# Patient Record
Sex: Female | Born: 1955 | Race: White | Hispanic: No | Marital: Single | Smoking: Never smoker
Health system: Southern US, Community
[De-identification: ages and names within clinical notes are randomized; demographics above are authoritative.]

## PROBLEM LIST (undated history)

## (undated) DIAGNOSIS — R5381 Other malaise: Secondary | ICD-10-CM

## (undated) DIAGNOSIS — L0201 Cutaneous abscess of face: Secondary | ICD-10-CM

## (undated) DIAGNOSIS — Z0000 Encounter for general adult medical examination without abnormal findings: Secondary | ICD-10-CM

## (undated) DIAGNOSIS — Z1239 Encounter for other screening for malignant neoplasm of breast: Secondary | ICD-10-CM

## (undated) DIAGNOSIS — R739 Hyperglycemia, unspecified: Secondary | ICD-10-CM

## (undated) DIAGNOSIS — Z1211 Encounter for screening for malignant neoplasm of colon: Secondary | ICD-10-CM

## (undated) DIAGNOSIS — L03211 Cellulitis of face: Secondary | ICD-10-CM

## (undated) DIAGNOSIS — Z124 Encounter for screening for malignant neoplasm of cervix: Secondary | ICD-10-CM

## (undated) DIAGNOSIS — Z13220 Encounter for screening for lipoid disorders: Secondary | ICD-10-CM

## (undated) DIAGNOSIS — F79 Unspecified intellectual disabilities: Secondary | ICD-10-CM

## (undated) DIAGNOSIS — R5383 Other fatigue: Secondary | ICD-10-CM

## (undated) HISTORY — DX: Encounter for screening for malignant neoplasm of cervix: Z12.4

## (undated) HISTORY — DX: Cellulitis of face: L03.211

## (undated) HISTORY — DX: Encounter for other screening for malignant neoplasm of breast: Z12.39

## (undated) HISTORY — DX: Encounter for screening for lipoid disorders: Z13.220

## (undated) HISTORY — DX: Hyperglycemia, unspecified: R73.9

## (undated) HISTORY — DX: Cutaneous abscess of face: L02.01

## (undated) HISTORY — DX: Encounter for screening for malignant neoplasm of colon: Z12.11

## (undated) HISTORY — DX: Encounter for general adult medical examination without abnormal findings: Z00.00

## (undated) HISTORY — DX: Unspecified intellectual disabilities: F79

## (undated) HISTORY — DX: Other malaise: R53.81

## (undated) HISTORY — DX: Other fatigue: R53.83

---

## 2005-12-21 ENCOUNTER — Ambulatory Visit: Payer: Self-pay | Admitting: Internal Medicine

## 2006-02-27 ENCOUNTER — Ambulatory Visit: Payer: Self-pay | Admitting: Family Medicine

## 2006-03-01 ENCOUNTER — Ambulatory Visit: Payer: Self-pay | Admitting: Family Medicine

## 2006-03-04 ENCOUNTER — Ambulatory Visit: Payer: Self-pay | Admitting: Family Medicine

## 2006-03-11 ENCOUNTER — Ambulatory Visit: Payer: Self-pay | Admitting: Family Medicine

## 2006-03-20 ENCOUNTER — Ambulatory Visit: Payer: Self-pay | Admitting: Family Medicine

## 2006-04-01 ENCOUNTER — Ambulatory Visit: Payer: Self-pay | Admitting: Family Medicine

## 2006-04-05 ENCOUNTER — Ambulatory Visit: Payer: Self-pay | Admitting: Family Medicine

## 2006-04-29 ENCOUNTER — Ambulatory Visit: Payer: Self-pay | Admitting: Family Medicine

## 2006-11-08 ENCOUNTER — Ambulatory Visit: Payer: Self-pay | Admitting: Family Medicine

## 2006-11-08 DIAGNOSIS — L03211 Cellulitis of face: Secondary | ICD-10-CM

## 2006-11-08 DIAGNOSIS — L0201 Cutaneous abscess of face: Secondary | ICD-10-CM

## 2009-06-08 ENCOUNTER — Other Ambulatory Visit: Admission: RE | Admit: 2009-06-08 | Discharge: 2009-06-08 | Payer: Self-pay | Admitting: Family Medicine

## 2009-06-08 ENCOUNTER — Ambulatory Visit: Payer: Self-pay | Admitting: Family Medicine

## 2009-06-08 DIAGNOSIS — R5381 Other malaise: Secondary | ICD-10-CM

## 2009-06-08 DIAGNOSIS — R5383 Other fatigue: Secondary | ICD-10-CM

## 2009-06-13 ENCOUNTER — Encounter (INDEPENDENT_AMBULATORY_CARE_PROVIDER_SITE_OTHER): Payer: Self-pay | Admitting: *Deleted

## 2009-06-13 LAB — CONVERTED CEMR LAB
Alkaline Phosphatase: 66 units/L (ref 39–117)
Basophils Absolute: 0 10*3/uL (ref 0.0–0.1)
Bilirubin, Direct: 0.1 mg/dL (ref 0.0–0.3)
Calcium: 9.5 mg/dL (ref 8.4–10.5)
Eosinophils Absolute: 0.2 10*3/uL (ref 0.0–0.7)
GFR calc non Af Amer: 110.14 mL/min (ref >60)
HCT: 37.7 % (ref 36.0–46.0)
HDL: 42.4 mg/dL (ref >39.00)
Hemoglobin: 13.5 g/dL (ref 12.0–15.0)
Lymphs Abs: 1.9 10*3/uL (ref 0.7–4.0)
MCHC: 35.8 g/dL (ref 30.0–36.0)
Monocytes Relative: 5.2 % (ref 3.0–12.0)
Neutro Abs: 3.4 10*3/uL (ref 1.4–7.7)
RDW: 12.4 % (ref 11.5–14.6)
Sodium: 144 meq/L (ref 135–145)
Total Bilirubin: 0.5 mg/dL (ref 0.3–1.2)
Total CHOL/HDL Ratio: 6
Triglycerides: 279 mg/dL — ABNORMAL HIGH (ref 0.0–149.0)

## 2009-06-14 ENCOUNTER — Encounter (INDEPENDENT_AMBULATORY_CARE_PROVIDER_SITE_OTHER): Payer: Self-pay | Admitting: *Deleted

## 2009-07-04 ENCOUNTER — Ambulatory Visit: Payer: Self-pay | Admitting: Family Medicine

## 2009-07-04 ENCOUNTER — Encounter: Payer: Self-pay | Admitting: Family Medicine

## 2009-07-07 ENCOUNTER — Encounter: Payer: Self-pay | Admitting: Family Medicine

## 2009-07-11 ENCOUNTER — Ambulatory Visit: Payer: Self-pay | Admitting: Family Medicine

## 2009-07-11 ENCOUNTER — Encounter (INDEPENDENT_AMBULATORY_CARE_PROVIDER_SITE_OTHER): Payer: Self-pay | Admitting: *Deleted

## 2009-07-12 LAB — CONVERTED CEMR LAB
AST: 14 units/L (ref 0–37)
Cholesterol: 180 mg/dL (ref 0–200)
HDL: 44.8 mg/dL (ref >39.00)
LDL Cholesterol: 101 mg/dL — ABNORMAL HIGH (ref 0–99)
VLDL: 34.4 mg/dL (ref 0.0–40.0)

## 2009-08-05 ENCOUNTER — Encounter (INDEPENDENT_AMBULATORY_CARE_PROVIDER_SITE_OTHER): Payer: Self-pay | Admitting: *Deleted

## 2009-08-10 ENCOUNTER — Ambulatory Visit: Payer: Self-pay | Admitting: Internal Medicine

## 2009-08-24 ENCOUNTER — Ambulatory Visit: Payer: Self-pay | Admitting: Internal Medicine

## 2010-03-21 NOTE — Miscellaneous (Signed)
Summary: [facility] Previsit/prep  Clinical Lists Changes  Medications: Added new medication of DULCOLAX 5 MG  TBEC (BISACODYL) Day before procedure take 2 at 3pm and 2 at 8pm. - Signed Added new medication of METOCLOPRAMIDE HCL 10 MG  TABS (METOCLOPRAMIDE HCL) As per prep instructions. - Signed Added new medication of MIRALAX   POWD (POLYETHYLENE GLYCOL 3350) As per prep  instructions. - Signed Rx of DULCOLAX 5 MG  TBEC (BISACODYL) Day before procedure take 2 at 3pm and 2 at 8pm.;  #4 x 0;  Signed;  Entered by: Wyona Almas RN;  Authorized by: Hart Carwin MD;  Method used: Electronically to [facility]*, [address] [place], [place]  [address], Ph: [phone], Fax: [phone] Rx of METOCLOPRAMIDE HCL 10 MG  TABS (METOCLOPRAMIDE HCL) As per prep instructions.;  #2 x 0;  Signed;  Entered by: Wyona Almas RN;  Authorized by: Hart Carwin MD;  Method used: Electronically to [facility]*, [address] [place], [place]  [address], Ph: [phone], Fax: [phone] Rx of MIRALAX   POWD (POLYETHYLENE GLYCOL 3350) As per prep  instructions.;  #255gm x 0;  Signed;  Entered by: Wyona Almas RN;  Authorized by: Hart Carwin MD;  Method used: Electronically to [facility]*, [address], [place], [place]  [address], Ph: [phone], Fax: [phone] Observations: Added new observation of ALLERGY REV: Done (08/10/2009 16:16)    Prescriptions: MIRALAX   POWD (POLYETHYLENE GLYCOL 3350) As per prep  instructions.  #255gm x 0   Entered by:   Wyona Almas RN   Authorized by:   Hart Carwin MD   Signed by:   Wyona Almas RN on 08/10/2009   Method used:   Electronically to        [facility]* (retail)       [address]       [place], [place]  [address]       Ph: [phone]       Fax: [phone]   RxID:   [id number] METOCLOPRAMIDE HCL 10 MG  TABS (METOCLOPRAMIDE HCL) As per prep instructions.  #2 x 0   Entered by:   Wyona Almas RN   Authorized by:   Hart Carwin MD   Signed by:   Wyona Almas RN on 08/10/2009   Method used:   Electronically to        [facility]* (retail)       [address]       [place], [place]  [address]       Ph: [phone]       Fax: [phone]   RxID:   [id number] DULCOLAX 5 MG  TBEC (BISACODYL) Day before procedure take 2 at 3pm and 2 at 8pm.  #4 x 0   Entered by:   Wyona Almas RN   Authorized by:   Hart Carwin MD   Signed by:   Wyona Almas RN on 08/10/2009   Method used:   Electronically to        [facility]* (retail)       [address]       [place], [place]  [address]       Ph: [phone]       Fax: [phone]   RxID:   [id number]

## 2010-03-21 NOTE — Letter (Signed)
Summary: Results Follow up Letter  [place] at [facility]  [address] [place], [place] [address]   Phone: [phone]  Fax: [phone]    06/14/2009 MRN: [id number]    [facility] [address] [place], [place]  [address]    Dear [name],  The following are the results of your recent test(s):  Test         Result    Pap Smear:        Normal __X___  Not Normal _____ Comments: ______________________________________________________ Cholesterol: LDL(Bad cholesterol):         Your goal is less than:         HDL (Good cholesterol):       Your goal is more than: Comments:  ______________________________________________________ Mammogram:        Normal _____  Not Normal _____ Comments:  ___________________________________________________________________ Hemoccult:        Normal _____  Not normal _______ Comments:    _____________________________________________________________________ Other Tests:    We routinely do not discuss normal results over the telephone.  If you desire a copy of the results, or you have any questions about this information we can discuss them at your next office visit.   Sincerely,    Ruthe Mannan,  M.D.  TA:lsf

## 2010-03-21 NOTE — Letter (Signed)
Summary: Previsit letter  [facility]  [address] [place], [place] [address]   Phone: [phone]  Fax: [phone]       07/11/2009 MRN: [id number]  [facility] [address] [place], [place]  [address]  Dear [name],  Welcome to the Gastroenterology Division at [facility].    You are scheduled to see a nurse for your pre-procedure visit on 08-10-09 at 4:30p.m. on the 3rd floor at [facility], [address]  We ask that you try to arrive at our office 15 minutes prior to your appointment time to allow for check-in.  Your nurse visit will consist of discussing your medical and surgical history, your immediate family medical history, and your medications.    Please bring a complete list of all your medications or, if you prefer, bring the medication bottles and we will list them.  We will need to be aware of both prescribed and over the counter drugs.  We will need to know exact dosage information as well.  If you are on blood thinners (Coumadin, Plavix, Aggrenox, Ticlid, etc.) please call our office today/prior to your appointment, as we need to consult with your physician about holding your medication.   Please be prepared to read and sign documents such as consent forms, a financial agreement, and acknowledgement forms.  If necessary, and with your consent, a friend or relative is welcome to sit-in on the nurse visit with you.  Please bring your insurance card so that we may make a copy of it.  If your insurance requires a referral to see a specialist, please bring your referral form from your primary care physician.  No co-pay is required for this nurse visit.     If you cannot keep your appointment, please call [phone] to cancel or reschedule prior to your appointment date.  This allows [place] the opportunity to schedule an appointment for another patient in need of care.    Thank you for choosing [facility] for your  medical needs.  We appreciate the opportunity to care for you.  Please visit [place] at our website  to learn more about our practice.                     Sincerely.                                                                                                                   The Gastroenterology Division

## 2010-03-21 NOTE — Assessment & Plan Note (Signed)
Summary: TICK BITE / LFW   Vital Signs:  Patient profile:   55 year old female Height:      61.5 inches Weight:      178 pounds BMI:     33.21 Temp:     98.3 degrees F oral Pulse rate:   72 / minute Pulse rhythm:   regular BP sitting:   120 / 80  (right arm) Cuff size:   large  Vitals Entered By: Linde Gillis CMA Duncan Dull) (Jul 11, 2009 10:04 AM) CC: ? Tick bite   History of Present Illness: 55 yo here for tick bite.  Felt something bite her last night on her right buttocks. Pulled something off but it wa dark and didnt see what it was. Since then, has a little redness and irritation at the site. No fevers, chills, malaise, headache, rash, nausea, vomiting or photophobia.  Current Medications (verified): 1)  Tylenol Pm Extra Strength 500-25 Mg Tabs (Diphenhydramine-Apap (Sleep)) .... Otc As Directed. 2)  Multivitamins   Tabs (Multiple Vitamin) .... Take 1 Tablet By Mouth Once A Day 3)  Pedi-Dri 100000 Unit/gm Powd (Nystatin) .... Apply Under Breast Daily. 4)  Simvastatin 20 Mg Tabs (Simvastatin) .... Take 1 Tab By Mouth At Bedtime  Allergies (verified): No Known Drug Allergies  Review of Systems      See HPI General:  Denies chills and fever. GI:  Denies abdominal pain, diarrhea, nausea, and vomiting. Neuro:  Denies headaches.  Physical Exam  General:  Well-developed,well-nourished,in no acute distress; alert,appropriate and cooperative throughout examination Skin:  2 cm area of erythema, no warmth No other rashes Psych:  baseline MR   Impression & Recommendations:  Problem # 1:  TICK BITE (ICD-E906.4) Assessment New Reassurance provided. Red flag symptoms, including headaches, fever, rash, malaise given requiring follow up.  Complete Medication List: 1)  Tylenol Pm Extra Strength 500-25 Mg Tabs (Diphenhydramine-apap (sleep)) .... Otc as directed. 2)  Multivitamins Tabs (Multiple vitamin) .... Take 1 tablet by mouth once a day 3)  Pedi-dri 100000 Unit/gm Powd  (Nystatin) .... Apply under breast daily. 4)  Simvastatin 20 Mg Tabs (Simvastatin) .... Take 1 tab by mouth at bedtime  Other Orders: Gastroenterology Referral (GI)  Patient Instructions: 1)  Please stop by to see Shirlee Limerick on your way out.  Current Allergies (reviewed today): No known allergies

## 2010-03-21 NOTE — Procedures (Signed)
Summary: Colonoscopy  Patient: Catherine Orr Note: All result statuses are Final unless otherwise noted.  Tests: (1) Colonoscopy (COL)   COL Colonoscopy           DONE     [facility]     [address]     [place], [place]  [address]           COLONOSCOPY PROCEDURE REPORT           PATIENT:  Catherine Orr, Catherine Orr  MR#:  [id number]     BIRTHDATE:  1955/04/13, 53 yrs. old  GENDER:  female     ENDOSCOPIST:  Hedwig Morton. Juanda Chance, MD     REF. BY:  Ruthe Mannan, M.D.     PROCEDURE DATE:  08/24/2009     PROCEDURE:  Colonoscopy 19[address]     ASA CLASS:  Class I     INDICATIONS:  Routine Risk Screening     MEDICATIONS:   Versed 8 mg, Fentanyl 75 mcg           DESCRIPTION OF PROCEDURE:   After the risks benefits and     alternatives of the procedure were thoroughly explained, informed     consent was obtained.  Digital rectal exam was performed and     revealed no rectal masses.   The LB CF-[id number] endoscope     was introduced through the anus and advanced to the cecum, which     was identified by both the appendix and ileocecal valve, without     limitations.  The quality of the prep was good, using MiraLax.     The instrument was then slowly withdrawn as the colon was fully     examined.     <<PROCEDUREIMAGES>>           FINDINGS:  No polyps or cancers were seen (see image1, image2,     image3, image4, and image5).   Retroflexed views in the rectum     revealed no abnormalities.    The scope was then withdrawn from     the patient and the procedure completed.           COMPLICATIONS:  None     ENDOSCOPIC IMPRESSION:     1) No polyps or cancers     2) Normal colonoscopy     RECOMMENDATIONS:     1) high fiber diet     REPEAT EXAM:  In 10 year(s) for.           ______________________________     Hedwig Morton. Juanda Chance, MD           CC:           n.     eSIGNED:   Hedwig Morton. Dannel Rafter at 08/24/2009 09:46 AM           Catherine Orr, [id number]  Note: An exclamation mark (!) indicates a  result that was not dispersed into the flowsheet. Document Creation Date: 08/24/2009 9:47 AM _______________________________________________________________________  (1) Order result status: Final Collection or observation date-time: 08/24/2009 09:38 Requested date-time:  Receipt date-time:  Reported date-time:  Referring Physician:   Ordering Physician: Lina Sar [phone]) Specimen Source:  Source: Launa Grill Order Number: [id number] Lab site:   Appended Document: Colonoscopy    Clinical Lists Changes  Observations: Added new observation of COLONNXTDUE: 08/2019 (08/24/2009 13:43)

## 2010-03-21 NOTE — Miscellaneous (Signed)
  Medications Added SIMVASTATIN 20 MG TABS (SIMVASTATIN) Take 1 tab by mouth at bedtime       Clinical Lists Changes  Medications: Added new medication of SIMVASTATIN 20 MG TABS (SIMVASTATIN) Take 1 tab by mouth at bedtime

## 2010-03-21 NOTE — Letter (Signed)
Summary: [facility] Instructions  [facility]  [address] [place], [place] [address]   Phone: [phone]  Fax: [phone]       ARNITRA SOKOLOSKI    [birth year]    MRN: [id number]       Procedure Day Dorna Bloom:  [facility]  08/24/09     Arrival Time: 8:00AM     Procedure Time:  9:00AM     Location of Procedure:                    _ X_  [facility] (4th Floor)    PREPARATION FOR COLONOSCOPY WITH MIRALAX  Starting 5 days prior to your procedure 08/19/09 do not eat nuts, seeds, popcorn, corn, beans, peas,  salads, or any raw vegetables.  Do not take any fiber supplements (e.g. Metamucil, Citrucel, and Benefiber). ____________________________________________________________________________________________________   THE DAY BEFORE YOUR PROCEDURE         DATE: 08/23/09  DAY: TUESDAY  1   Drink clear liquids the entire day-NO SOLID FOOD  2   Do not drink anything colored red or purple.  Avoid juices with pulp.  No orange juice.  3   Drink at least 64 oz. (8 glasses) of fluid/clear liquids during the day to prevent dehydration and help the prep work efficiently.  CLEAR LIQUIDS INCLUDE: Water Jello Ice Popsicles Tea (sugar ok, no milk/cream) Powdered fruit flavored drinks Coffee (sugar ok, no milk/cream) Gatorade Juice: apple, white grape, white cranberry  Lemonade Clear bullion, consomm, broth Carbonated beverages (any kind) Strained chicken noodle soup Hard Candy  4   Mix the entire bottle of Miralax with 64 oz. of Gatorade/Powerade in the morning and put in the refrigerator to chill.  5   At 3:00 pm take 2 Dulcolax/Bisacodyl tablets.  6   At 4:30 pm take one Reglan/Metoclopramide tablet.  7  Starting at 5:00 pm drink one 8 oz glass of the Miralax mixture every 15-20 minutes until you have finished drinking the entire 64 oz.  You should finish drinking prep around 7:30 or 8:00 pm.  8   If you are nauseated, you may take the 2nd Reglan/Metoclopramide  tablet at 6:30 pm.        9    At 8:00 pm take 2 more DULCOLAX/Bisacodyl tablets.     THE DAY OF YOUR PROCEDURE      DATE:  08/24/09  DAY: Lulu Riding  You may drink clear liquids until 7:00AM  (2 HOURS BEFORE PROCEDURE).   MEDICATION INSTRUCTIONS  Unless otherwise instructed, you should take regular prescription medications with a small sip of water as early as possible the morning of your procedure.         OTHER INSTRUCTIONS  You will need a responsible adult at least 57 years of age to accompany you and drive you home.   This person must remain in the waiting room during your procedure.  Wear loose fitting clothing that is easily removed.  Leave jewelry and other valuables at home.  However, you may wish to bring a book to read or an iPod/MP3 player to listen to music as you wait for your procedure to start.  Remove all body piercing jewelry and leave at home.  Total time from sign-in until discharge is approximately 2-3 hours.  You should go home directly after your procedure and rest.  You can resume normal activities the day after your procedure.  The day of your procedure you should not:   Drive  Make legal decisions   Operate machinery   Drink alcohol   Return to work  You will receive specific instructions about eating, activities and medications before you leave.   The above instructions have been reviewed and explained to me by  Wyona Almas RN  August 10, 2009 5:04 PM     I fully understand and can verbalize these instructions _____________________________ Date _______

## 2010-03-21 NOTE — Assessment & Plan Note (Signed)
Summary: CPX/BILLIE'S PT/CLE   Vital Signs:  Patient profile:   55 year old female Height:      61.5 inches Weight:      178.13 pounds BMI:     33.23 Temp:     98.5 degrees F oral Pulse rate:   92 / minute Pulse rhythm:   regular BP sitting:   124 / 86  (left arm) Cuff size:   regular  Vitals Entered By: Lewanda Rife LPN (June 08, 2009 9:09 AM) CC: Complete physical Billie Bean's pt LMP 2003   History of Present Illness: 55 yo pt new to me here for CPX.  Well woman- last pap smear was in 2000. Not currently sexually active, never had an abnormal pap smear. LMP 8 years ago, no post menopausal bleeding. No hot flashes or other symptoms. Due for mammogram, FLP, BMET and Tetanus. Refusing colonscopy or stool studies.  Itchy rash under her breasts. Noticed it months ago, tried putting lotion on it, made it worse.  Fatigue- has been very tired lately.  No syncope or prescynope. No CP or SOB, no LE edema. Does often have difficulty falling and staying asleep, gets anxious.  Current Medications (verified): 1)  Tylenol Pm Extra Strength 500-25 Mg Tabs (Diphenhydramine-Apap (Sleep)) .... Otc As Directed. 2)  Multivitamins   Tabs (Multiple Vitamin) .... Take 1 Tablet By Mouth Once A Day 3)  Pedi-Dri 100000 Unit/gm Powd (Nystatin) .... Apply Under Breast Daily.  Allergies (verified): No Known Drug Allergies  Past History:  Past Medical History: MR  Family History: Mom died in 2s, DM  Social History: Lives in group home.  Review of Systems      See HPI General:  Complains of fatigue; denies loss of appetite and malaise. Eyes:  Denies blurring. ENT:  Denies difficulty swallowing. CV:  Denies difficulty breathing at night, palpitations, and shortness of breath with exertion. Resp:  Denies shortness of breath. GI:  Denies abdominal pain, bloody stools, and change in bowel habits. GU:  Denies discharge and dysuria. MS:  Denies muscle weakness. Derm:  Denies  rash. Neuro:  Denies headaches. Psych:  Denies anxiety and depression. Endo:  Denies cold intolerance and heat intolerance.  Physical Exam  General:  Well-developed,well-nourished,in no acute distress; alert,appropriate and cooperative throughout examination Head:  Normocephalic and atraumatic without obvious abnormalities. No apparent alopecia or balding. Eyes:  vision grossly intact, pupils equal, pupils round, pupils reactive to light, and no injection.   Ears:  R ear normal and L ear normal.   Nose:  no nasal discharge.   Mouth:  pharynx pink and moist.   Neck:  No deformities, masses, or tenderness noted. Breasts:  skin/areolae normal, no masses, no abnormal thickening, and rash inframammory fold(s).   Lungs:  Normal respiratory effort, chest expands symmetrically. Lungs are clear to auscultation, no crackles or wheezes. Heart:  Normal rate and regular rhythm. S1 and S2 normal without gallop, murmur, click, rub or other extra sounds. Abdomen:  Bowel sounds positive,abdomen soft and non-tender without masses, organomegaly or hernias noted. Genitalia:  Pelvic Exam:        External: normal female genitalia without lesions or masses        Vagina: normal without lesions or masses        Cervix: normal without lesions or masses        Adnexa: normal bimanual exam without masses or fullness        Uterus: normal by palpation  Pap smear: performed Extremities:  no edema Neurologic:  alert & oriented X3.   Skin:  no suspicious lesions.   Psych:  baseline MR   Impression & Recommendations:  Problem # 1:  PHYSICAL EXAMINATION (ICD-V70.0) Reviewed preventive care protocols, scheduled due services, and updated immunizations Discussed nutrition, exercise, diet, and healthy lifestyle.  Set up mammogram today. Tetanus today. FLP, BMET, hepatic panel today. Pap today. Orders: Venipuncture ([address]) TLB-BMP (Basic Metabolic Panel-BMET) (80048-METABOL)  Problem # 2:  SCREENING FOR  MALIGNANT NEOPLASM OF THE CERVIX (ICD-V76.2)  Orders: Pap Smear, Thin Prep ( Collection of) (U0454)  Problem # 3:  FATIGUE (ICD-780.79) Assessment: New Likely related to difficulty falling and staying asleep but will check CBC and TSH today. Orders: TLB-CBC Platelet - w/Differential (85025-CBCD) TLB-TSH (Thyroid Stimulating Hormone) (84443-TSH)  Complete Medication List: 1)  Tylenol Pm Extra Strength 500-25 Mg Tabs (Diphenhydramine-apap (sleep)) .... Otc as directed. 2)  Multivitamins Tabs (Multiple vitamin) .... Take 1 tablet by mouth once a day 3)  Pedi-dri 100000 Unit/gm Powd (Nystatin) .... Apply under breast daily.  Other Orders: Radiology Referral (Radiology) TLB-Lipid Panel (80061-LIPID) TLB-Hepatic/Liver Function Pnl (80076-HEPATIC) TD Toxoids IM 7 YR + ([address]) Admin 1st Vaccine (91478)  Patient Instructions: 1)  Great to meet you, Corrie Dandy. 2)  Please stop by to see Shirlee Limerick on your way out to set up your mammogram. Prescriptions: PEDI-DRI 100000 UNIT/GM POWD (NYSTATIN) Apply under breast daily.  #1 x 0   Entered and Authorized by:   Ruthe Mannan MD   Signed by:   Ruthe Mannan MD on 06/08/2009   Method used:   Electronically to        [facility]* (retail)       [address]       [place], [place]  [address]       Ph: [phone]       Fax: [phone]   RxID:   [id number]   Current Allergies (reviewed today): No known allergies   Colonoscopy Next Due:  Refused   Immunizations Administered:  Tetanus Vaccine:    Vaccine Type: Td    Site: right deltoid    Mfr: Sanofi Pasteur    Dose: 0.5 ml    Route: IM    Given by: Lewanda Rife LPN    Exp. Date: 01/04/2011    Lot #: [id number]    VIS given: 01/07/07 version given June 08, 2009.

## 2010-03-21 NOTE — Letter (Signed)
Summary: Results Follow up Letter  [place] at [facility]  [address] [place], [place] [address]   Phone: [phone]  Fax: [phone]    07/07/2009 MRN: [id number]    [facility] [address] [place], [place]  [address]    Dear Ms. Alanis,  The following are the results of your recent test(s):  Test         Result    Pap Smear:        Normal _____  Not Normal _____ Comments: ______________________________________________________ Cholesterol: LDL(Bad cholesterol):         Your goal is less than:         HDL (Good cholesterol):       Your goal is more than: Comments:  ______________________________________________________ Mammogram:        Normal __X___  Not Normal _____ Comments:Please repeat mammogram in one year. ___________________________________________________________________ Hemoccult:        Normal _____  Not normal _______ Comments:    _____________________________________________________________________ Other Tests:    We routinely do not discuss normal results over the telephone.  If you desire a copy of the results, or you have any questions about this information we can discuss them at your next office visit.   Sincerely,    Idamae Schuller Duvan Mousel,MD  MT/ri

## 2010-03-21 NOTE — Miscellaneous (Signed)
Summary: mammogram screening   Clinical Lists Changes  Observations: Added new observation of MAMMO DUE: 07/2010 (07/07/2009 12:38) Added new observation of MAMMOGRAM: normal (07/04/2009 12:38)      Preventive Care Screening  Mammogram:    Date:  07/04/2009    Next Due:  07/2010    Results:  normal

## 2010-05-11 ENCOUNTER — Encounter: Payer: Self-pay | Admitting: Family Medicine

## 2010-05-12 ENCOUNTER — Ambulatory Visit: Payer: Self-pay | Admitting: Family Medicine

## 2010-07-24 ENCOUNTER — Other Ambulatory Visit: Payer: Self-pay

## 2010-07-24 MED ORDER — SIMVASTATIN 20 MG PO TABS: 20.0000 mg | ORAL_TABLET | Freq: Every day | ORAL | Status: DC

## 2010-07-24 NOTE — Telephone Encounter (Signed)
[facility] faxed refill request for Simvastatin 20mg  #30. Refill 330 with 1 add'l refill given with note for pt to call back for appt.

## 2010-10-17 ENCOUNTER — Other Ambulatory Visit: Payer: Self-pay

## 2010-10-17 MED ORDER — SIMVASTATIN 20 MG PO TABS: 20.0000 mg | ORAL_TABLET | Freq: Every day | ORAL | Status: DC

## 2010-10-17 NOTE — Telephone Encounter (Signed)
[facility] faxed refill request simvastatin 20mg  #30 x 0 with note pt needs to call for appt.

## 2011-11-26 ENCOUNTER — Ambulatory Visit (INDEPENDENT_AMBULATORY_CARE_PROVIDER_SITE_OTHER)
Admission: RE | Admit: 2011-11-26 | Discharge: 2011-11-26 | Disposition: A | Discharge status: Home / Self Care | Payer: Medicare Other | Source: Ambulatory Visit | Attending: Family Medicine | Admitting: Family Medicine

## 2011-11-26 ENCOUNTER — Encounter: Payer: Self-pay | Admitting: Family Medicine

## 2011-11-26 ENCOUNTER — Ambulatory Visit (INDEPENDENT_AMBULATORY_CARE_PROVIDER_SITE_OTHER): Payer: Self-pay | Admitting: Family Medicine

## 2011-11-26 VITALS — BP 156/78 | HR 88 | Temp 98.2°F | Wt 194.0 lb

## 2011-11-26 DIAGNOSIS — M25569 Pain in unspecified knee: Secondary | ICD-10-CM

## 2011-11-26 DIAGNOSIS — M25561 Pain in right knee: Secondary | ICD-10-CM

## 2011-11-26 NOTE — Patient Instructions (Addendum)
Let's get an xray of your knee. Keep that knee elevated. You may have a meniscal injury so we will have you see Dr. Patsy Lager if your symptoms do not improve.

## 2011-11-26 NOTE — Addendum Note (Signed)
Addended by: Dianne Dun on: 11/26/2011 04:22 PM   Modules accepted: Orders

## 2011-11-26 NOTE — Progress Notes (Signed)
SUBJECTIVE: Catherine Orr is a 56 y.o. female who sustained a right knee injury 1 week(s) ago. Mechanism of injury: she was walking and felt her knee given out. Immediate symptoms: immediate pain. Symptoms have been intermittent since that time. Prior history of related problems: no prior problems with this area in the past.  Patient Active Problem List  Diagnosis  . CELLULITIS, FACE  . FATIGUE   Past Medical History  Diagnosis Date  . MR (mental retardation)   . Cellulitis and abscess of face   . Other malaise and fatigue   . Other screening breast examination   . Routine general medical examination at a health care facility   . Screening for lipoid disorders   . Screening for malignant neoplasm of the cervix   . Special screening for malignant neoplasms, colon   . 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 History  Substance Use Topics  . Smoking status: Never Smoker   . Smokeless tobacco: Not on file  . Alcohol Use: Not on file   Family History  Problem Relation Age of Onset  . Diabetes Mother    No Known Allergies Current Outpatient Prescriptions on File Prior to Visit  Medication Sig Dispense Refill  . diphenhydramine-acetaminophen (TYLENOL PM) 25-500 MG TABS Take 1 tablet by mouth at bedtime as needed.        . Multiple Vitamin (MULTIVITAMIN) tablet Take 1 tablet by mouth daily.        Marland Kitchen Nystatin (PEDI-DRI) 100000 UNIT/GM POWD Apply topically daily.        . simvastatin (ZOCOR) 20 MG tablet Take 1 tablet (20 mg total) by mouth at bedtime.  30 tablet  0   The PMH, PSH, Social History, Family History, Medications, and allergies have been reviewed in [facility], and have been updated if relevant.  OBJECTIVE: BP 156/78  Pulse 88  Temp 98.2 F (36.8 C)  Wt 194 lb (87.998 kg)  Vital signs as noted above. Appearance: alert, well appearing, and in no distress. Knee exam: antalgic gait, effusion, reduced range of motion, negative drawer  sign. X-ray: ordered, but results not yet available.  ASSESSMENT: Knee sprain but ? Meniscal injury.  PLAN: rest the injured area as much as practical, apply ice packs, elevate the injured limb, X-Ray ordered, continue using knee brace. See orders for this visit as documented in the electronic medical record.

## 2011-11-27 ENCOUNTER — Telehealth: Payer: Self-pay | Admitting: Family Medicine

## 2011-11-27 NOTE — Addendum Note (Signed)
Addended by: Alvina Chou on: 11/27/2011 03:29 PM   Modules accepted: Orders

## 2011-11-27 NOTE — Telephone Encounter (Signed)
Patient left message on triage line stating she would like the results of her knee xray. Call back # [phone]

## 2011-11-28 NOTE — Telephone Encounter (Signed)
Pt's cousin. Lawson Radar, has been advised of results, per [facility].

## 2012-07-17 ENCOUNTER — Telehealth: Payer: Self-pay

## 2012-07-17 NOTE — Telephone Encounter (Signed)
Catherine Orr said pt started on 07/13/12 with ? Fever, sweaty feeling, non productive cough, pt thinks wheezing on and off, no SOB or difficulty breathing and no CP. Dr Dayton Martes does not have available appt and scheduled with Dr Ermalene Searing on 07/18/12 at 9:45 am. If pt condition worsens or changes prior to appt pt will go to [facility].

## 2012-07-18 ENCOUNTER — Encounter: Payer: Self-pay | Admitting: Family Medicine

## 2012-07-18 ENCOUNTER — Ambulatory Visit (INDEPENDENT_AMBULATORY_CARE_PROVIDER_SITE_OTHER): Payer: Medicare Other | Admitting: Family Medicine

## 2012-07-18 VITALS — BP 120/60 | HR 105 | Temp 98.1°F | Ht 61.5 in | Wt 192.5 lb

## 2012-07-18 DIAGNOSIS — J209 Acute bronchitis, unspecified: Secondary | ICD-10-CM | POA: Insufficient documentation

## 2012-07-18 MED ORDER — AZITHROMYCIN 250 MG PO TABS: ORAL_TABLET | ORAL | Status: DC

## 2012-07-18 NOTE — Assessment & Plan Note (Signed)
>   7 days... Treat with antibiotics.

## 2012-07-18 NOTE — Progress Notes (Signed)
  Subjective:    Patient ID: Catherine Orr, female    DOB: November 17, 1955, 57 y.o.   MRN: [id number]  Cough This is a new problem. The current episode started in the past 7 days. The problem has been gradually worsening. The problem occurs constantly. The cough is productive of sputum. Associated symptoms include chills, ear congestion and rhinorrhea. Pertinent negatives include no shortness of breath or weight loss. Nothing aggravates the symptoms. Risk factors: nonsmoker. Treatments tried: dayquil equivalent, cough drops. Her past medical history is significant for environmental allergies. There is no history of asthma, bronchiectasis, bronchitis, COPD, emphysema or pneumonia.      Review of Systems  Constitutional: Positive for chills. Negative for weight loss.  HENT: Positive for rhinorrhea.   Respiratory: Negative for shortness of breath.   Allergic/Immunologic: Positive for environmental allergies.       Objective:   Physical Exam        Assessment & Plan:

## 2012-07-18 NOTE — Patient Instructions (Signed)
Plain mucinex/robitussin twice daily. Drink a lot of water. Complete antibiotics. Call to let us know if you are not feeling better in next 3-4 days.

## 2012-07-22 ENCOUNTER — Ambulatory Visit: Payer: Medicare Other | Admitting: Family Medicine

## 2013-02-10 ENCOUNTER — Encounter: Payer: Self-pay | Admitting: Family Medicine

## 2013-02-10 ENCOUNTER — Ambulatory Visit (INDEPENDENT_AMBULATORY_CARE_PROVIDER_SITE_OTHER): Payer: Medicare Other | Admitting: Family Medicine

## 2013-02-10 VITALS — BP 152/94 | HR 92 | Temp 98.0°F | Wt 199.2 lb

## 2013-02-10 DIAGNOSIS — J069 Acute upper respiratory infection, unspecified: Secondary | ICD-10-CM | POA: Insufficient documentation

## 2013-02-10 LAB — POCT RAPID STREP A (OFFICE): Rapid Strep A Screen: NEGATIVE

## 2013-02-10 MED ORDER — BENZONATATE 200 MG PO CAPS: 200.0000 mg | ORAL_CAPSULE | Freq: Three times a day (TID) | ORAL | Status: DC | PRN

## 2013-02-10 NOTE — Assessment & Plan Note (Signed)
Likely viral, typical exam.  Nontoxic.  Tessalon prn cough.  F/u prn.  See instructions.

## 2013-02-10 NOTE — Patient Instructions (Signed)
Tessalon for cough.  Drink plenty of fluids, take tylenol as needed, and gargle with warm salt water for your throat.  This should gradually improve.  Take care.  Let us know if you have other concerns.

## 2013-02-10 NOTE — Progress Notes (Signed)
Pre-visit discussion using our clinic review tool. No additional management support is needed unless otherwise documented below in the visit note.  Sx started about 4 days ago.  ST.  HA.  Cough.  Some sputum.  No fevers known, but some sweats at night.  No myalgias.  Hadn't had a flu shot this year.  Discussed, encouraged.  No vomiting, no diarrhea.  No rash.  Some mild L ear pain, prev but not now.    Meds, vitals, and allergies reviewed.   ROS: See HPI.  Otherwise, noncontributory.  GEN: nad, alert and oriented HEENT: mucous membranes moist, tm w/o erythema, nasal exam w/o erythema, clear discharge noted,  OP with cobblestoning and irritation on the soft palate typical of viral URI, sinuses not ttp NECK: supple w/o LA CV: rrr.   PULM: ctab, no inc wob EXT: no edema SKIN: no acute rash

## 2014-09-13 ENCOUNTER — Encounter: Payer: Self-pay | Admitting: Internal Medicine

## 2014-09-23 ENCOUNTER — Ambulatory Visit (INDEPENDENT_AMBULATORY_CARE_PROVIDER_SITE_OTHER): Payer: Commercial Managed Care - HMO | Admitting: Family Medicine

## 2014-09-23 ENCOUNTER — Ambulatory Visit (INDEPENDENT_AMBULATORY_CARE_PROVIDER_SITE_OTHER)
Admission: RE | Admit: 2014-09-23 | Discharge: 2014-09-23 | Disposition: A | Discharge status: Home / Self Care | Payer: Commercial Managed Care - HMO | Source: Ambulatory Visit | Attending: Family Medicine | Admitting: Family Medicine

## 2014-09-23 ENCOUNTER — Encounter: Payer: Self-pay | Admitting: Family Medicine

## 2014-09-23 VITALS — BP 126/70 | HR 104 | Temp 98.0°F | Wt 198.5 lb

## 2014-09-23 DIAGNOSIS — M1712 Unilateral primary osteoarthritis, left knee: Secondary | ICD-10-CM | POA: Diagnosis not present

## 2014-09-23 DIAGNOSIS — M7989 Other specified soft tissue disorders: Secondary | ICD-10-CM

## 2014-09-23 DIAGNOSIS — R21 Rash and other nonspecific skin eruption: Secondary | ICD-10-CM | POA: Insufficient documentation

## 2014-09-23 DIAGNOSIS — M25569 Pain in unspecified knee: Secondary | ICD-10-CM | POA: Insufficient documentation

## 2014-09-23 DIAGNOSIS — M25562 Pain in left knee: Secondary | ICD-10-CM

## 2014-09-23 MED ORDER — TRIAMCINOLONE ACETONIDE 0.1 % EX CREA: 1.0000 "application " | TOPICAL_CREAM | Freq: Two times a day (BID) | CUTANEOUS | Status: DC

## 2014-09-23 NOTE — Progress Notes (Signed)
Pre visit review using our clinic review tool, if applicable. No additional management support is needed unless otherwise documented below in the visit note. 

## 2014-09-23 NOTE — Assessment & Plan Note (Signed)
New- intermittent. No swelling today. Advised to keep leg elevated- likely dependent. Call or return to clinic prn if these symptoms worsen or fail to improve as anticipated. The patient indicates understanding of these issues and agrees with the plan.

## 2014-09-23 NOTE — Assessment & Plan Note (Signed)
New- ongoing for at least a year. Appearance quite classic for psoriasis. eRx sent for triamcinolone Call or return to clinic prn if these symptoms worsen or fail to improve as anticipated. The patient indicates understanding of these issues and agrees with the plan.

## 2014-09-23 NOTE — Progress Notes (Signed)
Subjective:   Patient ID: Catherine Orr, female    DOB: [birth year], 59 y.o.   MRN: [id number]  NIMISHA RATHEL is a pleasant 59 y.o. year old female who presents to clinic today with Leg Swelling  on 09/23/2014  HPI:  I have not seen patient in almost 3 years.  Left ankle swelling and knee pain- intermittent for past few weeks.  No known injury but she has been walking more.  Seems to be worse at the end of the day.  No CP or SOB.  No erythema or warmth over area that hurts.  Rash on her elbows bilaterally- has been there for at least year, comes and goes.  Sometimes itches, always flaky.  Never painful or draining.   No current outpatient prescriptions on file prior to visit.   No current facility-administered medications on file prior to visit.    No Known Allergies  Past Medical History  Diagnosis Date  . MR (mental retardation)   . Cellulitis and abscess of face   . Other malaise and fatigue   . Other screening breast examination   . Routine general medical examination at a health care facility   . Screening for lipoid disorders   . Screening for malignant neoplasm of the cervix   . Special screening for malignant neoplasms, colon   . 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 No pertinent past surgical history.  Family History  Problem Relation Age of Onset  . Diabetes Mother     History   Social History  . Marital Status: Single    Spouse Name: N/A  . Number of Children: N/A  . Years of Education: N/A   Occupational History  . Not on file.   Social History Main Topics  . Smoking status: Never Smoker   . Smokeless tobacco: Not on file  . Alcohol Use: Not on file  . Drug Use: Not on file  . Sexual Activity: Not on file   Other Topics Concern  . Not on file   Social History Narrative   Lives in group home   The PMH, PSH, Social History, Family History, Medications, and allergies have been reviewed in [facility], and have been updated  if relevant.   Review of Systems  Constitutional: Negative.   HENT: Negative.   Respiratory: Negative.   Cardiovascular: Positive for leg swelling. Negative for chest pain and palpitations.  Genitourinary: Negative.   Skin: Positive for rash.  Neurological: Negative.   Hematological: Negative.   Psychiatric/Behavioral: Negative.   All other systems reviewed and are negative.      Objective:    BP 126/70 mmHg  Pulse 104  Temp(Src) 98 F (36.7 C) (Oral)  Wt 198 lb 8 oz (90.039 kg)  SpO2 98%   Physical Exam  Constitutional: She is oriented to person, place, and time. She appears well-developed and well-nourished. No distress.  HENT:  Head: Normocephalic and atraumatic.  Eyes: Conjunctivae are normal.  Neck: Normal range of motion.  Cardiovascular: Normal rate.   Pulmonary/Chest: Effort normal.  Musculoskeletal:       Left knee: She exhibits effusion and abnormal meniscus. She exhibits normal range of motion, no swelling, no ecchymosis, no deformity, no laceration, no erythema, normal alignment, no LCL laxity, no bony tenderness and no MCL laxity. No tenderness found.       Legs: Neurological: She is alert and oriented to person, place, and time. No cranial nerve deficit.  Skin: Rash noted.  Psychiatric: She has a normal mood and affect. Her behavior is normal. Judgment and thought content normal.  Nursing note and vitals reviewed.         Assessment & Plan:   Left leg swelling - Plan: DG Knee Complete 4 Views Left  Rash and nonspecific skin eruption  Left knee pain No Follow-up on file.

## 2014-09-23 NOTE — Patient Instructions (Signed)
Great to see you.  I will call you with your xray results.  Try the triamcinolone cream on your elbows twice daily for no more than 10 days without telling me.

## 2014-09-23 NOTE — Assessment & Plan Note (Signed)
New- intermittent. ?Internal derangement/OA. Knee xray today for further evaluation.

## 2014-10-27 ENCOUNTER — Other Ambulatory Visit: Payer: Self-pay | Admitting: Family Medicine

## 2014-10-27 DIAGNOSIS — Z01419 Encounter for gynecological examination (general) (routine) without abnormal findings: Secondary | ICD-10-CM

## 2014-11-01 ENCOUNTER — Other Ambulatory Visit (INDEPENDENT_AMBULATORY_CARE_PROVIDER_SITE_OTHER): Payer: Commercial Managed Care - HMO

## 2014-11-01 DIAGNOSIS — Z01419 Encounter for gynecological examination (general) (routine) without abnormal findings: Secondary | ICD-10-CM

## 2014-11-01 DIAGNOSIS — Z0000 Encounter for general adult medical examination without abnormal findings: Secondary | ICD-10-CM

## 2014-11-01 LAB — LIPID PANEL
CHOL/HDL RATIO: 7
CHOLESTEROL: 255 mg/dL — AB (ref 0–200)
HDL: 37.8 mg/dL — AB (ref >39.00)
NonHDL: 217.12
Triglycerides: 258 mg/dL — ABNORMAL HIGH (ref 0.0–149.0)
VLDL: 51.6 mg/dL — AB (ref 0.0–40.0)

## 2014-11-01 LAB — COMPREHENSIVE METABOLIC PANEL
ALBUMIN: 4.4 g/dL (ref 3.5–5.2)
ALK PHOS: 80 U/L (ref 39–117)
ALT: 24 U/L (ref 0–35)
AST: 15 U/L (ref 0–37)
BUN: 9 mg/dL (ref 6–23)
CHLORIDE: 101 meq/L (ref 96–112)
CO2: 31 mEq/L (ref 19–32)
Calcium: 9.7 mg/dL (ref 8.4–10.5)
Creatinine, Ser: 0.66 mg/dL (ref 0.40–1.20)
GFR: 96.82 mL/min (ref >60.00)
Glucose, Bld: 127 mg/dL — ABNORMAL HIGH (ref 70–99)
POTASSIUM: 4 meq/L (ref 3.5–5.1)
SODIUM: 142 meq/L (ref 135–145)
TOTAL PROTEIN: 6.8 g/dL (ref 6.0–8.3)
Total Bilirubin: 0.6 mg/dL (ref 0.2–1.2)

## 2014-11-01 LAB — CBC WITH DIFFERENTIAL/PLATELET
BASOS PCT: 0.4 % (ref 0.0–3.0)
Basophils Absolute: 0 10*3/uL (ref 0.0–0.1)
EOS ABS: 0.2 10*3/uL (ref 0.0–0.7)
EOS PCT: 3.7 % (ref 0.0–5.0)
HCT: 42.9 % (ref 36.0–46.0)
Hemoglobin: 14.5 g/dL (ref 12.0–15.0)
LYMPHS ABS: 2.2 10*3/uL (ref 0.7–4.0)
Lymphocytes Relative: 32.9 % (ref 12.0–46.0)
MCHC: 33.8 g/dL (ref 30.0–36.0)
MCV: 89.2 fl (ref 78.0–100.0)
MONO ABS: 0.4 10*3/uL (ref 0.1–1.0)
Monocytes Relative: 6.1 % (ref 3.0–12.0)
NEUTROS ABS: 3.7 10*3/uL (ref 1.4–7.7)
NEUTROS PCT: 56.9 % (ref 43.0–77.0)
PLATELETS: 350 10*3/uL (ref 150.0–400.0)
RBC: 4.81 Mil/uL (ref 3.87–5.11)
RDW: 12.7 % (ref 11.5–15.5)
WBC: 6.6 10*3/uL (ref 4.0–10.5)

## 2014-11-01 LAB — TSH: TSH: 1.15 u[IU]/mL (ref 0.35–4.50)

## 2014-11-01 LAB — LDL CHOLESTEROL, DIRECT: LDL DIRECT: 194 mg/dL

## 2014-11-02 LAB — HIV ANTIBODY (ROUTINE TESTING W REFLEX): HIV 1&2 Ab, 4th Generation: NONREACTIVE

## 2014-11-02 LAB — HEPATITIS C ANTIBODY: HCV Ab: NEGATIVE

## 2014-11-08 ENCOUNTER — Other Ambulatory Visit (HOSPITAL_COMMUNITY)
Admission: RE | Admit: 2014-11-08 | Discharge: 2014-11-08 | Disposition: A | Discharge status: Home / Self Care | Payer: Commercial Managed Care - HMO | Source: Ambulatory Visit | Attending: Family Medicine | Admitting: Family Medicine

## 2014-11-08 ENCOUNTER — Ambulatory Visit (INDEPENDENT_AMBULATORY_CARE_PROVIDER_SITE_OTHER): Payer: Commercial Managed Care - HMO | Admitting: Family Medicine

## 2014-11-08 ENCOUNTER — Encounter: Payer: Self-pay | Admitting: Family Medicine

## 2014-11-08 VITALS — BP 150/94 | HR 83 | Temp 98.1°F | Ht 61.5 in | Wt 197.2 lb

## 2014-11-08 DIAGNOSIS — Z0000 Encounter for general adult medical examination without abnormal findings: Secondary | ICD-10-CM

## 2014-11-08 DIAGNOSIS — R739 Hyperglycemia, unspecified: Secondary | ICD-10-CM

## 2014-11-08 DIAGNOSIS — Z1239 Encounter for other screening for malignant neoplasm of breast: Secondary | ICD-10-CM | POA: Diagnosis not present

## 2014-11-08 DIAGNOSIS — Z23 Encounter for immunization: Secondary | ICD-10-CM

## 2014-11-08 DIAGNOSIS — Z1151 Encounter for screening for human papillomavirus (HPV): Secondary | ICD-10-CM | POA: Diagnosis not present

## 2014-11-08 DIAGNOSIS — Z01419 Encounter for gynecological examination (general) (routine) without abnormal findings: Secondary | ICD-10-CM | POA: Insufficient documentation

## 2014-11-08 DIAGNOSIS — E785 Hyperlipidemia, unspecified: Secondary | ICD-10-CM | POA: Diagnosis not present

## 2014-11-08 DIAGNOSIS — Z124 Encounter for screening for malignant neoplasm of cervix: Secondary | ICD-10-CM | POA: Insufficient documentation

## 2014-11-08 HISTORY — DX: Hyperglycemia, unspecified: R73.9

## 2014-11-08 MED ORDER — SIMVASTATIN 20 MG PO TABS: 20.0000 mg | ORAL_TABLET | Freq: Every day | ORAL | Status: DC

## 2014-11-08 NOTE — Progress Notes (Signed)
Subjective:   Patient ID: Catherine Orr, female    DOB: May 25, 1955, 59 y.o.   MRN: [id number]  Catherine Orr is a pleasant 59 y.o. year old female who presents to clinic today with Annual Exam  on 11/08/2014  HPI:  I have personally reviewed the Medicare Annual Wellness questionnaire and have noted 1. The patient's medical and social history 2. Their use of alcohol, tobacco or illicit drugs 3. Their current medications and supplements 4. The patient's functional ability including ADL's, fall risks, home safety risks and hearing or visual             impairment. 5. Diet and physical activities 6. Evidence for depression or mood disorders  End of life wishes discussed and updated in Social History.  The roster of all physicians providing medical care to patient - is listed in the [employer] section of the chart.  Colonoscopy 08/24/09- Dr. Juanda Chance Overdue for pap smear- denies any post menopausal bleeding. Overdue for mammogram. Zostavax 2015  Hyperglycemia- does have family history of DM.  Glucose 127 on CMET- unsure if she was truly fasting. Denies any increased thirst or urination.  HLD- lipids extremely elevated this month.  Was on zocor years ago.  Willing to restart it.  Lab Results  Component Value Date   CHOL 255* 11/01/2014   HDL 37.80* 11/01/2014   LDLCALC 101* 07/11/2009   LDLDIRECT 194.0 11/01/2014   TRIG 258.0* 11/01/2014   CHOLHDL 7 11/01/2014   Lab Results  Component Value Date   CREATININE 0.66 11/01/2014   Current Outpatient Prescriptions on File Prior to Visit  Medication Sig Dispense Refill  . triamcinolone cream (KENALOG) 0.1 % Apply 1 application topically 2 (two) times daily. 30 g 0   No current facility-administered medications on file prior to visit.    No Known Allergies  Past Medical History  Diagnosis Date  . MR (mental retardation)   . Cellulitis and abscess of face   . Other malaise and fatigue   . Other screening breast  examination   . Routine general medical examination at a health care facility   . Screening for lipoid disorders   . Screening for malignant neoplasm of the cervix   . Special screening for malignant neoplasms, colon   . 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 No pertinent past surgical history.  Family History  Problem Relation Age of Onset  . Diabetes Mother     Social History   Social History  . Marital Status: Single    Spouse Name: N/A  . Number of Children: N/A  . Years of Education: N/A   Occupational History  . Not on file.   Social History Main Topics  . Smoking status: Never Smoker   . Smokeless tobacco: Not on file  . Alcohol Use: Not on file  . Drug Use: Not on file  . Sexual Activity: Not on file   Other Topics Concern  . Not on file   Social History Narrative   Lives in group home   The PMH, PSH, Social History, Family History, Medications, and allergies have been reviewed in [facility], and have been updated if relevant.   Review of Systems  Constitutional: Negative.   HENT: Negative.   Eyes: Negative.   Respiratory: Negative.   Cardiovascular: Negative.   Gastrointestinal: Negative.   Endocrine: Negative.   Genitourinary: Negative.   Musculoskeletal: Negative.   Skin: Negative.   Allergic/Immunologic: Negative.   Hematological: Negative.  Psychiatric/Behavioral: Negative.   All other systems reviewed and are negative.      Objective:    BP 150/94 mmHg  Pulse 83  Temp(Src) 98.1 F (36.7 C) (Oral)  Ht 5' 1.5" (1.562 m)  Wt 197 lb 4 oz (89.472 kg)  BMI 36.67 kg/m2  SpO2 95%  Wt Readings from Last 3 Encounters:  11/08/14 197 lb 4 oz (89.472 kg)  09/23/14 198 lb 8 oz (90.039 kg)  02/10/13 199 lb 4 oz (90.379 kg)    Physical Exam    General:  Well-developed,well-nourished,in no acute distress; alert,appropriate and cooperative throughout examination Head:  normocephalic and atraumatic.   Eyes:  vision grossly  intact, pupils equal, pupils round, and pupils reactive to light.   Ears:  R ear normal and L ear normal.   Nose:  no external deformity.   Mouth:  good dentition.   Neck:  No deformities, masses, or tenderness noted. Breasts:  No mass, nodules, thickening, tenderness, bulging, retraction, inflamation, nipple discharge or skin changes noted.   Lungs:  Normal respiratory effort, chest expands symmetrically. Lungs are clear to auscultation, no crackles or wheezes. Heart:  Normal rate and regular rhythm. S1 and S2 normal without gallop, murmur, click, rub or other extra sounds. Abdomen:  Bowel sounds positive,abdomen soft and non-tender without masses, organomegaly or hernias noted. Rectal:  no external abnormalities.   Genitalia:  Pelvic Exam:        External: normal female genitalia without lesions or masses        Vagina: normal without lesions or masses        Cervix: normal without lesions or masses        Adnexa: normal bimanual exam without masses or fullness        Uterus: normal by palpation        Pap smear: performed Msk:  No deformity or scoliosis noted of thoracic or lumbar spine.   Extremities:  No clubbing, cyanosis, edema, or deformity noted with normal full range of motion of all joints.   Neurologic:  alert & oriented X3 and gait normal.   Skin:  Intact without suspicious lesions or rashes Cervical Nodes:  No lymphadenopathy noted Axillary Nodes:  No palpable lymphadenopathy Psych:  Cognition and judgment appear intact. Alert and cooperative with normal attention span and concentration. No apparent delusions, illusions, hallucinations      Assessment & Plan:   Medicare annual wellness visit, subsequent  Encounter for routine gynecological examination No Follow-up on file.

## 2014-11-08 NOTE — Addendum Note (Signed)
Addended by: Desmond Dike on: 11/08/2014 12:37 PM   Modules accepted: Orders

## 2014-11-08 NOTE — Assessment & Plan Note (Signed)
Pap smear and breast exam done today. 

## 2014-11-08 NOTE — Progress Notes (Signed)
Pre visit review using our clinic review tool, if applicable. No additional management support is needed unless otherwise documented below in the visit note. 

## 2014-11-08 NOTE — Patient Instructions (Addendum)
Great to see you. Please call to set up your mammogram.  We are restarting zocor 20 mg daily. Please return in 8 weeks for labs.

## 2014-11-08 NOTE — Assessment & Plan Note (Signed)
Restarting zocor 20 mg daily. Repeat labs in 8 weeks.

## 2014-11-08 NOTE — Assessment & Plan Note (Signed)
When pt returns in 8 weeks for, will check a1c.

## 2014-11-08 NOTE — Assessment & Plan Note (Signed)
The patients weight, height, BMI and visual acuity have been recorded in the chart.  Cognitive function assessed.   I have made referrals, counseling and provided education to the patient based review of the above and I have provided the pt with a written personalized care plan for preventive services.  Influenza vaccine given today. Mammogram ordered.

## 2014-11-09 LAB — CYTOLOGY - PAP

## 2014-11-10 ENCOUNTER — Encounter: Payer: Self-pay | Admitting: *Deleted

## 2015-01-03 ENCOUNTER — Other Ambulatory Visit (INDEPENDENT_AMBULATORY_CARE_PROVIDER_SITE_OTHER): Payer: Commercial Managed Care - HMO

## 2015-01-03 DIAGNOSIS — E785 Hyperlipidemia, unspecified: Secondary | ICD-10-CM

## 2015-01-03 DIAGNOSIS — R739 Hyperglycemia, unspecified: Secondary | ICD-10-CM | POA: Diagnosis not present

## 2015-01-03 LAB — LIPID PANEL
CHOL/HDL RATIO: 5
CHOLESTEROL: 189 mg/dL (ref 0–200)
HDL: 36.3 mg/dL — AB (ref >39.00)
NonHDL: 152.64
TRIGLYCERIDES: 238 mg/dL — AB (ref 0.0–149.0)
VLDL: 47.6 mg/dL — AB (ref 0.0–40.0)

## 2015-01-03 LAB — COMPREHENSIVE METABOLIC PANEL
ALBUMIN: 4.2 g/dL (ref 3.5–5.2)
ALT: 18 U/L (ref 0–35)
AST: 14 U/L (ref 0–37)
Alkaline Phosphatase: 75 U/L (ref 39–117)
BUN: 12 mg/dL (ref 6–23)
CALCIUM: 9.6 mg/dL (ref 8.4–10.5)
CHLORIDE: 104 meq/L (ref 96–112)
CO2: 28 meq/L (ref 19–32)
Creatinine, Ser: 0.68 mg/dL (ref 0.40–1.20)
GFR: 93.49 mL/min (ref >60.00)
Glucose, Bld: 175 mg/dL — ABNORMAL HIGH (ref 70–99)
POTASSIUM: 3.7 meq/L (ref 3.5–5.1)
Sodium: 141 mEq/L (ref 135–145)
Total Bilirubin: 0.5 mg/dL (ref 0.2–1.2)
Total Protein: 6.9 g/dL (ref 6.0–8.3)

## 2015-01-03 LAB — LDL CHOLESTEROL, DIRECT: LDL DIRECT: 128 mg/dL

## 2015-01-03 LAB — HEMOGLOBIN A1C: HEMOGLOBIN A1C: 6.3 % (ref 4.6–6.5)

## 2015-01-05 ENCOUNTER — Encounter: Payer: Self-pay | Admitting: *Deleted

## 2015-11-04 ENCOUNTER — Other Ambulatory Visit: Payer: Self-pay | Admitting: Family Medicine

## 2015-11-04 DIAGNOSIS — R739 Hyperglycemia, unspecified: Secondary | ICD-10-CM

## 2015-11-04 DIAGNOSIS — E785 Hyperlipidemia, unspecified: Secondary | ICD-10-CM

## 2015-11-04 DIAGNOSIS — Z0000 Encounter for general adult medical examination without abnormal findings: Secondary | ICD-10-CM

## 2015-11-07 ENCOUNTER — Other Ambulatory Visit: Payer: Self-pay

## 2015-11-07 ENCOUNTER — Ambulatory Visit (INDEPENDENT_AMBULATORY_CARE_PROVIDER_SITE_OTHER): Payer: Commercial Managed Care - HMO

## 2015-11-07 ENCOUNTER — Other Ambulatory Visit: Payer: Medicare Other

## 2015-11-07 VITALS — BP 144/90 | HR 98 | Temp 98.4°F | Ht 61.5 in | Wt 207.0 lb

## 2015-11-07 DIAGNOSIS — R739 Hyperglycemia, unspecified: Secondary | ICD-10-CM

## 2015-11-07 DIAGNOSIS — Z23 Encounter for immunization: Secondary | ICD-10-CM

## 2015-11-07 DIAGNOSIS — Z0000 Encounter for general adult medical examination without abnormal findings: Secondary | ICD-10-CM | POA: Diagnosis not present

## 2015-11-07 DIAGNOSIS — E785 Hyperlipidemia, unspecified: Secondary | ICD-10-CM | POA: Diagnosis not present

## 2015-11-07 LAB — COMPREHENSIVE METABOLIC PANEL
ALT: 26 U/L (ref 0–35)
AST: 18 U/L (ref 0–37)
Albumin: 4.2 g/dL (ref 3.5–5.2)
Alkaline Phosphatase: 76 U/L (ref 39–117)
BUN: 10 mg/dL (ref 6–23)
CALCIUM: 9.4 mg/dL (ref 8.4–10.5)
CHLORIDE: 101 meq/L (ref 96–112)
CO2: 31 meq/L (ref 19–32)
Creatinine, Ser: 0.63 mg/dL (ref 0.40–1.20)
GFR: 101.82 mL/min (ref >60.00)
Glucose, Bld: 129 mg/dL — ABNORMAL HIGH (ref 70–99)
POTASSIUM: 4.2 meq/L (ref 3.5–5.1)
Sodium: 139 mEq/L (ref 135–145)
TOTAL PROTEIN: 6.9 g/dL (ref 6.0–8.3)
Total Bilirubin: 0.4 mg/dL (ref 0.2–1.2)

## 2015-11-07 LAB — HEMOGLOBIN A1C: Hgb A1c MFr Bld: 6.4 % (ref 4.6–6.5)

## 2015-11-07 LAB — LIPID PANEL
CHOL/HDL RATIO: 7
CHOLESTEROL: 255 mg/dL — AB (ref 0–200)
HDL: 37.1 mg/dL — ABNORMAL LOW (ref >39.00)

## 2015-11-07 LAB — LDL CHOLESTEROL, DIRECT: Direct LDL: 159 mg/dL

## 2015-11-07 LAB — TSH: TSH: 1.58 u[IU]/mL (ref 0.35–4.50)

## 2015-11-07 NOTE — Addendum Note (Signed)
Addended by: Alvina ChouWALSH, Kailana Benninger J on: 11/07/2015 11:31 AM   Modules accepted: Orders

## 2015-11-07 NOTE — Progress Notes (Signed)
PCP notes:   Health maintenance:  Flu vaccine - administered Mammogram - pt declined at this time  Abnormal screenings:   Mini-Cog score: 19/20  Patient concerns:   None  Nurse concerns:  None  Next PCP appt:   11/14/15 @ 0915

## 2015-11-07 NOTE — Patient Instructions (Signed)
[name] , Thank you for taking time to come for your Medicare Wellness Visit. I appreciate your ongoing commitment to your health goals. Please review the following plan we discussed and let me know if I can assist you in the future.   These are the goals we discussed: Goals    . Increase physical activity          Starting 11/07/2015, I will attempt to drink at least 6-8 glasses of water daily.        This is a list of the screening recommended for you and due dates:  Health Maintenance  Topic Date Due  . Mammogram  11/06/2016*  . Pap Smear  11/07/2017  . Tetanus Vaccine  06/09/2019  . Colon Cancer Screening  08/25/2019  . Flu Shot  Completed  . Shingles Vaccine  Addressed  .  Hepatitis C: One time screening is recommended by Center for Disease Control  (CDC) for  adults born from [birth year] through 1965.   Completed  . HIV Screening  Completed  *Topic was postponed. The date shown is not the original due date.   Preventive Care for Adults  A healthy lifestyle and preventive care can promote health and wellness. Preventive health guidelines for adults include the following key practices.  . A routine yearly physical is a good way to check with your health care provider about your health and preventive screening. It is a chance to share any concerns and updates on your health and to receive a thorough exam.  . Visit your dentist for a routine exam and preventive care every 6 months. Brush your teeth twice a day and floss once a day. Good oral hygiene prevents tooth decay and gum disease.  . The frequency of eye exams is based on your age, health, family medical history, use  of contact lenses, and other factors. Follow your health care provider's ecommendations for frequency of eye exams.  . Eat a healthy diet. Foods like vegetables, fruits, whole grains, low-fat dairy products, and lean protein foods contain the nutrients you need without too many calories. Decrease your intake  of foods high in solid fats, added sugars, and salt. Eat the right amount of calories for you. Get information about a proper diet from your health care provider, if necessary.  . Regular physical exercise is one of the most important things you can do for your health. Most adults should get at least 150 minutes of moderate-intensity exercise (any activity that increases your heart rate and causes you to sweat) each week. In addition, most adults need muscle-strengthening exercises on 2 or more days a week.  Silver Sneakers may be a benefit available to you. To determine eligibility, you may visit the website: www.silversneakers.com or contact program at (929)[phone] Mon-Fri between 8AM-8PM.   . Maintain a healthy weight. The body mass index (BMI) is a screening tool to identify possible weight problems. It provides an estimate of body fat based on height and weight. Your health care provider can find your BMI and can help you achieve or maintain a healthy weight.   For adults 20 years and older: ? A BMI below 18.5 is considered underweight. ? A BMI of 18.5 to 24.9 is normal. ? A BMI of 25 to 29.9 is considered overweight. ? A BMI of 30 and above is considered obese.   . Maintain normal blood lipids and cholesterol levels by exercising and minimizing your intake of saturated fat. Eat a balanced diet with plenty  of fruit and vegetables. Blood tests for lipids and cholesterol should begin at age 50 and be repeated every 5 years. If your lipid or cholesterol levels are high, you are over 50, or you are at high risk for heart disease, you may need your cholesterol levels checked more frequently. Ongoing high lipid and cholesterol levels should be treated with medicines if diet and exercise are not working.  . If you smoke, find out from your health care provider how to quit. If you do not use tobacco, please do not start.  . If you choose to drink alcohol, please do not consume more than 2 drinks  per day. One drink is considered to be 12 ounces (355 mL) of beer, 5 ounces (148 mL) of wine, or 1.5 ounces (44 mL) of liquor.  . If you are 40-28 years old, ask your health care provider if you should take aspirin to prevent strokes.  . Use sunscreen. Apply sunscreen liberally and repeatedly throughout the day. You should seek shade when your shadow is shorter than you. Protect yourself by wearing long sleeves, pants, a wide-brimmed hat, and sunglasses year round, whenever you are outdoors.  . Once a month, do a whole body skin exam, using a mirror to look at the skin on your back. Tell your health care provider of new moles, moles that have irregular borders, moles that are larger than a pencil eraser, or moles that have changed in shape or color.

## 2015-11-07 NOTE — Progress Notes (Signed)
Subjective:   Catherine GhaziMary E Orr is a 60 y.o. female who presents for Medicare Annual (Subsequent) preventive examination.  Review of Systems: N/A Cardiac Risk Factors include: dyslipidemia;obesity (BMI >30kg/m2);sedentary lifestyle     Objective:     Vitals: BP (!) 144/90 (BP Location: Left Arm, Patient Position: Sitting, Cuff Size: Normal)   Pulse 98   Temp 98.4 F (36.9 C) (Oral)   Ht 5' 1.5" (1.562 m)   Wt 207 lb (93.9 kg)   SpO2 94%   BMI 38.48 kg/m   Body mass index is 38.48 kg/m.   Tobacco History  Smoking Status  . Never Smoker  Smokeless Tobacco  . Never Used     Counseling given: No   Past Medical History:  Diagnosis Date  . 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 Cellulitis and abscess of face   . MR (mental retardation)   . Other malaise and fatigue   . Other screening breast examination   . Routine general medical examination at a health care facility   . Screening for lipoid disorders   . Screening for malignant neoplasm of the cervix   . Special screening for malignant neoplasms, colon    History reviewed. No pertinent surgical history. Family History  Problem Relation Age of Onset  . Diabetes Mother    History  Sexual Activity  . Sexual activity: No    Outpatient Encounter Prescriptions as of 11/07/2015  Medication Sig  . simvastatin (ZOCOR) 20 MG tablet Take 1 tablet (20 mg total) by mouth at bedtime. (Patient not taking: Reported on 11/07/2015)  . [DISCONTINUED] triamcinolone cream (KENALOG) 0.1 % Apply 1 application topically 2 (two) times daily.   No facility-administered encounter medications on file as of 11/07/2015.     Activities of Daily Living In your present state of health, do you have any difficulty performing the following activities: 11/07/2015  Hearing? N  Vision? N  Difficulty concentrating or making decisions? N  Walking or climbing stairs? N  Dressing or bathing? N  Doing errands, shopping? Y  Preparing Food and  eating ? N  Using the Toilet? N  In the past six months, have you accidently leaked urine? Y  Do you have problems with loss of bowel control? N  Managing your Medications? N  Managing your Finances? N  Housekeeping or managing your Housekeeping? N  Some recent data might be hidden    Patient Care Team: Dianne Dunalia M Aron, MD as PCP - General Hart Carwinora M Brodie, MD (Inactive) (Gastroenterology)    Assessment:     Hearing Screening   125Hz  250Hz  500Hz  1000Hz  2000Hz  3000Hz  4000Hz  6000Hz  8000Hz   Right ear:   40 40 40  40    Left ear:   40 40 40  40      Visual Acuity Screening   Right eye Left eye Both eyes  Without correction: 20/25 20/40-1 20/40  With correction:       Exercise Activities and Dietary recommendations Current Exercise Habits: The patient does not participate in regular exercise at present, Exercise limited by: None identified  Goals    . Increase physical activity          Starting 11/07/2015, I will attempt to drink at least 6-8 glasses of water daily.       Fall Risk Fall Risk  11/07/2015 11/08/2014  Falls in the past year? No No   Depression Screen PHQ 2/9 Scores 11/07/2015 11/08/2014  PHQ - 2 Score 0 0  Cognitive Testing MMSE - Mini Mental State Exam 11/07/2015  Orientation to time 5  Orientation to Place 5  Registration 3  Attention/ Calculation 0  Recall 2  Recall-comments pt was unable to recall 1 of 3 words  Language- name 2 objects 0  Language- repeat 1  Language- follow 3 step command 3  Language- read & follow direction 0  Write a sentence 0  Copy design 0  Total score 19    Immunization History  Administered Date(s) Administered  . Influenza,inj,Quad PF,36+ Mos 11/08/2014, 11/07/2015  . Influenza-Unspecified 11/16/2013  . Td 06/08/2009   Screening Tests Health Maintenance  Topic Date Due  . MAMMOGRAM  11/06/2016 (Originally 07/05/2011)  . PAP SMEAR  11/07/2017  . TETANUS/TDAP  06/09/2019  . COLONOSCOPY  08/25/2019  . INFLUENZA  VACCINE  Completed  . ZOSTAVAX  Addressed  . Hepatitis C Screening  Completed  . HIV Screening  Completed      Plan:     I have personally reviewed and addressed the Medicare Annual Wellness questionnaire and have noted the following in the patient's chart:  A. Medical and social history B. Use of alcohol, tobacco or illicit drugs  C. Current medications and supplements D. Functional ability and status E.  Nutritional status F.  Physical activity G. Advance directives H. List of other physicians I.  Hospitalizations, surgeries, and ER visits in previous 12 months J.  Vitals K. Screenings to include hearing, vision, cognitive, depression L. Referrals and appointments - none  In addition, I have reviewed and discussed with patient certain preventive protocols, quality metrics, and best practice recommendations. A written personalized care plan for preventive services as well as general preventive health recommendations were provided to patient.  See attached scanned questionnaire for additional information.   Signed,   Randa Evens, MHA, BS, LPN Health Advisor

## 2015-11-07 NOTE — Progress Notes (Signed)
Pre visit review using our clinic review tool, if applicable. No additional management support is needed unless otherwise documented below in the visit note. 

## 2015-11-07 NOTE — Telephone Encounter (Signed)
Pt was in today for AWV and labs. Pt reported she has not taken Simvastatin in approx. 1 month. Pt requested a refill. Forwarding to you for review and your decision whether to refill now or wait until her CPE on 11/14/15.

## 2015-11-07 NOTE — Addendum Note (Signed)
Addended by: Robert BellowPINSON, Alli Jasmer R on: 11/07/2015 06:44 PM   Modules accepted: Orders

## 2015-11-08 MED ORDER — SIMVASTATIN 20 MG PO TABS: 20.0000 mg | ORAL_TABLET | Freq: Every day | ORAL | 3 refills | Status: DC

## 2015-11-08 NOTE — Progress Notes (Signed)
I reviewed health advisor's note, was available for consultation, and agree with documentation and plan.  

## 2015-11-14 ENCOUNTER — Ambulatory Visit (INDEPENDENT_AMBULATORY_CARE_PROVIDER_SITE_OTHER): Payer: Commercial Managed Care - HMO | Admitting: Family Medicine

## 2015-11-14 ENCOUNTER — Encounter: Payer: Self-pay | Admitting: Family Medicine

## 2015-11-14 VITALS — BP 122/88 | HR 92 | Ht 61.25 in | Wt 205.0 lb

## 2015-11-14 DIAGNOSIS — E785 Hyperlipidemia, unspecified: Secondary | ICD-10-CM

## 2015-11-14 DIAGNOSIS — R739 Hyperglycemia, unspecified: Secondary | ICD-10-CM | POA: Diagnosis not present

## 2015-11-14 DIAGNOSIS — Z0000 Encounter for general adult medical examination without abnormal findings: Secondary | ICD-10-CM

## 2015-11-14 DIAGNOSIS — Z01419 Encounter for gynecological examination (general) (routine) without abnormal findings: Secondary | ICD-10-CM

## 2015-11-14 MED ORDER — METFORMIN HCL 500 MG PO TABS
500.0000 mg | ORAL_TABLET | Freq: Every day | ORAL | 3 refills | Status: DC
Start: 2015-11-14 — End: 2016-04-03

## 2015-11-14 MED ORDER — SIMVASTATIN 20 MG PO TABS: 20.0000 mg | ORAL_TABLET | Freq: Every day | ORAL | 3 refills | Status: DC

## 2015-11-14 NOTE — Assessment & Plan Note (Signed)
Reviewed preventive care protocols, scheduled due services, and updated immunizations Discussed nutrition, exercise, diet, and healthy lifestyle.  

## 2015-11-14 NOTE — Progress Notes (Signed)
Subjective:   Patient ID: Catherine Orr, female    DOB: 05-08-1955, 60 y.o.   MRN: [id number]  Catherine Orr is a pleasant 60 y.o. year old female who presents to clinic today with Annual Exam  and follow up of chronic medication conditions on 11/14/2015  HPI:  Had annual medicare wellness visit with Lu Duffel, RN on 11/07/15.  Note reviewed.  Hyperglycemia- a1c continues to increase.  Strong FH of DM. Lab Results  Component Value Date   HGBA1C 6.4 11/07/2015    HLD- lipids  elevated this month because she has been out of her zocor for one month.  Lab Results  Component Value Date   CHOL 255 (H) 11/07/2015   HDL 37.10 (L) 11/07/2015   LDLCALC 101 (H) 07/11/2009   LDLDIRECT 159.0 11/07/2015   TRIG (H) 11/07/2015    421.0 Triglyceride is over 400; calculations on Lipids are invalid.   CHOLHDL 7 11/07/2015   Lab Results  Component Value Date   CREATININE 0.63 11/07/2015   No current outpatient prescriptions on file prior to visit.   No current facility-administered medications on file prior to visit.     No Known Allergies  Past Medical History:  Diagnosis Date  . 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 Cellulitis and abscess of face   . MR (mental retardation)   . Other malaise and fatigue   . Other screening breast examination   . Routine general medical examination at a health care facility   . Screening for lipoid disorders   . Screening for malignant neoplasm of the cervix   . Special screening for malignant neoplasms, colon     No past surgical history on file.  Family History  Problem Relation Age of Onset  . Diabetes Mother     Social History   Social History  . Marital status: Single    Spouse name: N/A  . Number of children: N/A  . Years of education: N/A   Occupational History  . Not on file.   Social History Main Topics  . Smoking status: Never Smoker  . Smokeless tobacco: Never Used  . Alcohol use No  . Drug use: No    . Sexual activity: No   Other Topics Concern  . Not on file   Social History Narrative   Lives in group home   The PMH, PSH, Social History, Family History, Medications, and allergies have been reviewed in [facility], and have been updated if relevant.   Review of Systems  Constitutional: Negative.   HENT: Negative.   Eyes: Negative.   Respiratory: Negative.   Cardiovascular: Negative.   Gastrointestinal: Negative.   Endocrine: Negative.   Genitourinary: Negative.   Musculoskeletal: Negative.   Skin: Negative.   Allergic/Immunologic: Negative.   Hematological: Negative.   Psychiatric/Behavioral: Negative.   All other systems reviewed and are negative.      Objective:    BP 122/88   Pulse 92   Ht 5' 1.25" (1.556 m)   Wt 205 lb (93 kg)   SpO2 98%   BMI 38.42 kg/m   Wt Readings from Last 3 Encounters:  11/14/15 205 lb (93 kg)  11/07/15 207 lb (93.9 kg)  11/08/14 197 lb 4 oz (89.5 kg)    Physical Exam    General:  Well-developed,well-nourished,in no acute distress; alert,appropriate and cooperative throughout examination Head:  normocephalic and atraumatic.   Eyes:  vision grossly intact, pupils equal, pupils round, and pupils reactive to light.  Ears:  R ear normal and L ear normal.   Nose:  no external deformity.   Mouth:  good dentition.   Neck:  No deformities, masses, or tenderness noted.   Lungs:  Normal respiratory effort, chest expands symmetrically. Lungs are clear to auscultation, no crackles or wheezes. Heart:  Normal rate and regular rhythm. S1 and S2 normal without gallop, murmur, click, rub or other extra sounds. Abdomen:  Bowel sounds positive,abdomen soft and non-tender without masses, organomegaly or hernias noted. Msk:  No deformity or scoliosis noted of thoracic or lumbar spine.   Extremities:  No clubbing, cyanosis, edema, or deformity noted with normal full range of motion of all joints.   Neurologic:  alert & oriented X3 and gait normal.    Skin:  Intact without suspicious lesions or rashes Cervical Nodes:  No lymphadenopathy noted Axillary Nodes:  No palpable lymphadenopathy Psych:  Cognition and judgment appear intact. Alert and cooperative with normal attention span and concentration. No apparent delusions, illusions, hallucinations      Assessment & Plan:   Well woman exam  Medicare annual wellness visit, subsequent  HLD (hyperlipidemia)  Hyperglycemia - Plan: Hemoglobin A1c No Follow-up on file.

## 2015-11-14 NOTE — Assessment & Plan Note (Signed)
Deteriorated. Add Metformin 500 mg daily with breakfast. Refer to nutritionist. Follow up in 3 months.

## 2015-11-14 NOTE — Patient Instructions (Addendum)
Great to see you. We are starting are Metformin 500 mg daily with breakfast.  Please return in 3 months for labs.  We will call you with a referral to a nutritionist.

## 2015-11-14 NOTE — Assessment & Plan Note (Signed)
Deteriorated. Was out of rx. Rx refilled.

## 2016-04-03 ENCOUNTER — Other Ambulatory Visit: Payer: Self-pay

## 2016-04-03 NOTE — Telephone Encounter (Signed)
[place] [facility] left v/m that received denial on metformin [facility]ill [facility] to provider. [facility] and apologized that Mrs Catherine Orr is a pt of Dr Dellie CatholicArons and possible reason for denial was pt was to return for labs but not because pt is not known to provider.(see 11/14/15 office note) I am sending note to Dr Dayton MartesAron to confirm OK to give one refill for metformin and to have pt call office for lab appt. Since the pharmacy has received a denial of refill.Please advise.

## 2016-04-04 MED ORDER — METFORMIN HCL 500 MG PO TABS: 500.0000 mg | ORAL_TABLET | Freq: Every day | ORAL | 0 refills | Status: DC

## 2016-04-25 ENCOUNTER — Other Ambulatory Visit: Payer: Self-pay | Admitting: Family Medicine

## 2016-11-21 ENCOUNTER — Other Ambulatory Visit: Payer: Self-pay | Admitting: Family Medicine

## 2017-02-13 ENCOUNTER — Ambulatory Visit: Payer: Medicare HMO | Admitting: Primary Care

## 2017-02-13 ENCOUNTER — Encounter: Payer: Self-pay | Admitting: Primary Care

## 2017-02-13 VITALS — BP 144/94 | HR 101 | Temp 98.8°F | Ht 61.5 in | Wt 210.1 lb

## 2017-02-13 DIAGNOSIS — L409 Psoriasis, unspecified: Secondary | ICD-10-CM | POA: Diagnosis not present

## 2017-02-13 DIAGNOSIS — E785 Hyperlipidemia, unspecified: Secondary | ICD-10-CM | POA: Diagnosis not present

## 2017-02-13 DIAGNOSIS — R030 Elevated blood-pressure reading, without diagnosis of hypertension: Secondary | ICD-10-CM | POA: Diagnosis not present

## 2017-02-13 DIAGNOSIS — R7303 Prediabetes: Secondary | ICD-10-CM | POA: Diagnosis not present

## 2017-02-13 DIAGNOSIS — E119 Type 2 diabetes mellitus without complications: Secondary | ICD-10-CM | POA: Insufficient documentation

## 2017-02-13 DIAGNOSIS — E1165 Type 2 diabetes mellitus with hyperglycemia: Secondary | ICD-10-CM | POA: Insufficient documentation

## 2017-02-13 DIAGNOSIS — Z23 Encounter for immunization: Secondary | ICD-10-CM | POA: Diagnosis not present

## 2017-02-13 MED ORDER — TRIAMCINOLONE ACETONIDE 0.5 % EX OINT: 1.0000 "application " | TOPICAL_OINTMENT | Freq: Two times a day (BID) | CUTANEOUS | 0 refills | Status: DC

## 2017-02-13 NOTE — Assessment & Plan Note (Signed)
Lipid panel above goal from labs over one year ago. Lipid panel pending, she will return when fasting. Discussed the importance of a healthy diet and regular exercise in order for weight loss, and to reduce the risk of any potential medical problems.

## 2017-02-13 NOTE — Addendum Note (Signed)
Addended by: Tawnya CrookSAMBATH, Ericca Labra on: 02/13/2017 03:48 PM   Modules accepted: Orders

## 2017-02-13 NOTE — Patient Instructions (Signed)
Try triamcinolone ointment. Apply to the skin on your elbows twice daily for one week, then as needed. Do not apply this to your face.  Start monitoring your blood pressure daily, around the same time of day, for the next several weeks.  Ensure that you have rested for 30 minutes prior to checking. Please call me if your blood pressure gets at or above 140/90 on a consistent basis.  Start exercising. You should be getting 150 minutes of moderate intensity exercise weekly.  It is important that you improve your diet. Please limit carbohydrates in the form of white bread, rice, pasta, sweets, fast food, fried food, sugary drinks, etc. Increase your consumption of fresh fruits and vegetables, whole grains, lean protein.  Ensure you are consuming 64 ounces of water daily.  Schedule a lab only appointment to return for your fasting labs. I will send refills of your medications to your pharmacy once we get your labs back.  It was a pleasure meeting you!   Diabetes Mellitus and Nutrition When you have diabetes (diabetes mellitus), it is very important to have healthy eating habits because your blood sugar (glucose) levels are greatly affected by what you eat and drink. Eating healthy foods in the appropriate amounts, at about the same times every day, can help you:  Control your blood glucose.  Lower your risk of heart disease.  Improve your blood pressure.  Reach or maintain a healthy weight.  Every person with diabetes is different, and each person has different needs for a meal plan. Your health care provider may recommend that you work with a diet and nutrition specialist (dietitian) to make a meal plan that is best for you. Your meal plan may vary depending on factors such as:  The calories you need.  The medicines you take.  Your weight.  Your blood glucose, blood pressure, and cholesterol levels.  Your activity level.  Other health conditions you have, such as heart or kidney  disease.  How do carbohydrates affect me? Carbohydrates affect your blood glucose level more than any other type of food. Eating carbohydrates naturally increases the amount of glucose in your blood. Carbohydrate counting is a method for keeping track of how many carbohydrates you eat. Counting carbohydrates is important to keep your blood glucose at a healthy level, especially if you use insulin or take certain oral diabetes medicines. It is important to know how many carbohydrates you can safely have in each meal. This is different for every person. Your dietitian can help you calculate how many carbohydrates you should have at each meal and for snack. Foods that contain carbohydrates include:  Bread, cereal, rice, pasta, and crackers.  Potatoes and corn.  Peas, beans, and lentils.  Milk and yogurt.  Fruit and juice.  Desserts, such as cakes, cookies, ice cream, and candy.  How does alcohol affect me? Alcohol can cause a sudden decrease in blood glucose (hypoglycemia), especially if you use insulin or take certain oral diabetes medicines. Hypoglycemia can be a life-threatening condition. Symptoms of hypoglycemia (sleepiness, dizziness, and confusion) are similar to symptoms of having too much alcohol. If your health care provider says that alcohol is safe for you, follow these guidelines:  Limit alcohol intake to no more than 1 drink per day for nonpregnant women and 2 drinks per day for men. One drink equals 12 oz of beer, 5 oz of wine, or 1 oz of hard liquor.  Do not drink on an empty stomach.  Keep yourself hydrated  with water, diet soda, or unsweetened iced tea.  Keep in mind that regular soda, juice, and other mixers may contain a lot of sugar and must be counted as carbohydrates.  What are tips for following this plan? Reading food labels  Start by checking the serving size on the label. The amount of calories, carbohydrates, fats, and other nutrients listed on the label  are based on one serving of the food. Many foods contain more than one serving per package.  Check the total grams (g) of carbohydrates in one serving. You can calculate the number of servings of carbohydrates in one serving by dividing the total carbohydrates by 15. For example, if a food has 30 g of total carbohydrates, it would be equal to 2 servings of carbohydrates.  Check the number of grams (g) of saturated and trans fats in one serving. Choose foods that have low or no amount of these fats.  Check the number of milligrams (mg) of sodium in one serving. Most people should limit total sodium intake to less than 2,300 mg per day.  Always check the nutrition information of foods labeled as "low-fat" or "nonfat". These foods may be higher in added sugar or refined carbohydrates and should be avoided.  Talk to your dietitian to identify your daily goals for nutrients listed on the label. Shopping  Avoid buying canned, premade, or processed foods. These foods tend to be high in fat, sodium, and added sugar.  Shop around the outside edge of the grocery store. This includes fresh fruits and vegetables, bulk grains, fresh meats, and fresh dairy. Cooking  Use low-heat cooking methods, such as baking, instead of high-heat cooking methods like deep frying.  Cook using healthy oils, such as olive, canola, or sunflower oil.  Avoid cooking with butter, cream, or high-fat meats. Meal planning  Eat meals and snacks regularly, preferably at the same times every day. Avoid going long periods of time without eating.  Eat foods high in fiber, such as fresh fruits, vegetables, beans, and whole grains. Talk to your dietitian about how many servings of carbohydrates you can eat at each meal.  Eat 4-6 ounces of lean protein each day, such as lean meat, chicken, fish, eggs, or tofu. 1 ounce is equal to 1 ounce of meat, chicken, or fish, 1 egg, or 1/4 cup of tofu.  Eat some foods each day that contain  healthy fats, such as avocado, nuts, seeds, and fish. Lifestyle   Check your blood glucose regularly.  Exercise at least 30 minutes 5 or more days each week, or as told by your health care provider.  Take medicines as told by your health care provider.  Do not use any products that contain nicotine or tobacco, such as cigarettes and e-cigarettes. If you need help quitting, ask your health care provider.  Work with a Veterinary surgeon or diabetes educator to identify strategies to manage stress and any emotional and social challenges. What are some questions to ask my health care provider?  Do I need to meet with a diabetes educator?  Do I need to meet with a dietitian?  What number can I call if I have questions?  When are the best times to check my blood glucose? Where to find more information:  American Diabetes Association: diabetes.org/food-and-fitness/food  Academy of Nutrition and Dietetics: https://www.vargas.com/  [employer] of Diabetes and Digestive and Kidney Diseases (NIH): FindJewelers.cz Summary  A healthy meal plan will help you control your blood glucose and maintain a healthy  lifestyle.  Working with a diet and nutrition specialist (dietitian) can help you make a meal plan that is best for you.  Keep in mind that carbohydrates and alcohol have immediate effects on your blood glucose levels. It is important to count carbohydrates and to use alcohol carefully. This information is not intended to replace advice given to you by your health care provider. Make sure you discuss any questions you have with your health care provider. Document Released: 11/02/2004 Document Revised: 03/12/2016 Document Reviewed: 03/12/2016 Elsevier Interactive Patient Education  [employer]2018 Elsevier Inc.

## 2017-02-13 NOTE — Progress Notes (Signed)
Subjective:    Patient ID: Catherine Orr, female    DOB: [birth year], 61 y.o.   MRN: [id number]  HPI  Catherine Orr is a 61 year old female who presents today to transfer care from Dr. Dayton MartesAron.  1) Prediabetes:   Current medications include: Metformin 500 mg once daily. She's taken her Metformin in 2 months. She denies polyuria, polydipsia   Last A1C: 6.4 in September 2017, no recent lab on file. Last Eye Exam: No recent eye exam. Last Foot Exam: Due today. Pneumonia Vaccination: Never completed. Due today. ACE/ARB: None. No recent urine microalbumin on file. Statin: Simvastatin  Diet currently consists of:  Breakfast: Skips, cereal  Lunch: Meatloaf, mashed potatoes, green beans, hamburger steak, fish Dinner: Frozen dinners Snacks: Popcorn, nuts Desserts: None, ice cream once monthly Beverages: Sweet tea, soda, little water  Exercise: She is not exercising.    2) Hyperlipidemia: Currently managed on Simvastatin 20 mg. Lipid panel from September 2017 with TC of 255, Trigs of 421, LDL of 159.  3) Elevated Blood Pressure Reading: Currently not managed on medication. She does not check her blood pressure. She denies chest pain, dizziness, headaches.   BP Readings from Last 3 Encounters:  02/13/17 (!) 144/94  11/14/15 122/88  11/07/15 (!) 144/90   4) Psoriasis: Located to bilateral elbows for years, never formally diagnosed. Once provided with a prescription for triamcinolone cream with improvement. She denies itching and pain. She notes that the dry, scaly skin is embarrassing and is interested in treatment.   Review of Systems  Constitutional: Negative for fatigue and unexpected weight change.  Respiratory: Negative for shortness of breath.   Cardiovascular: Negative for chest pain.  Endocrine: Negative for polydipsia, polyphagia and polyuria.  Skin:       Dry, scaly skin to elbows  Neurological: Negative for dizziness, numbness and headaches.       Past Medical  History:  Diagnosis Date  . 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 Cellulitis and abscess of face   . MR (mental retardation)   . Other malaise and fatigue   . Other screening breast examination   . Routine general medical examination at a health care facility   . Screening for lipoid disorders   . Screening for malignant neoplasm of the cervix   . Special screening for malignant neoplasms, colon      Social History   Socioeconomic History  . Marital status: Single    Spouse name: Not on file  . Number of children: Not on file  . Years of education: Not on file  . Highest education level: Not on file  Social Needs  . Financial resource strain: Not on file  . Food insecurity - worry: Not on file  . Food insecurity - inability: Not on file  . Transportation needs - medical: Not on file  . Transportation needs - non-medical: Not on file  Occupational History  . Not on file  Tobacco Use  . Smoking status: Never Smoker  . Smokeless tobacco: Never Used  Substance and Sexual Activity  . Alcohol use: No  . Drug use: No  . Sexual activity: No  Other Topics Concern  . Not on file  Social History Narrative   Lives in group home    No past surgical history on file.  Family History  Problem Relation Age of Onset  . Diabetes Mother     No Known Allergies  Current Outpatient Medications on File Prior to Visit  Medication  Sig Dispense Refill  . FLUZONE QUADRIVALENT 0.5 ML injection ADM 0.5ML IM UTD  0  . metFORMIN (GLUCOPHAGE) 500 MG tablet TAKE 1 TABLET BY MOUTH ONCE A DAY WITH BREAKFAST (Patient not taking: Reported on 02/13/2017) 30 tablet 0  . simvastatin (ZOCOR) 20 MG tablet TAKE 1 TABLET BY MOUTH DAILY AT BEDTIME (Patient not taking: Reported on 02/13/2017) 30 tablet 0   No current facility-administered medications on file prior to visit.     BP (!) 144/94   Pulse (!) 101   Temp 98.8 F (37.1 C) (Oral)   Ht 5' 1.5" (1.562 m)   Wt 210 lb 1.9 oz (95.3 kg)    SpO2 94%   BMI 39.06 kg/m    Objective:   Physical Exam  Constitutional: She appears well-nourished.  Neck: Neck supple.  Cardiovascular: Normal rate and regular rhythm.  Pulmonary/Chest: Effort normal and breath sounds normal.  Skin: Skin is warm and dry.  Dry, silvery, scaly patches to bilateral elbows.   Psychiatric: She has a normal mood and affect.          Assessment & Plan:

## 2017-02-13 NOTE — Assessment & Plan Note (Signed)
A1C of 6.3 over one year ago. Will repeat A1C, she will return when fasting as we also need lipids.  Discussed the importance of a healthy diet and regular exercise in order for weight loss, and to reduce the risk of any potential medical problems.

## 2017-02-13 NOTE — Assessment & Plan Note (Signed)
Above goal today, even on recheck. Also noted several above goal readings. Will have her start monitoring her BP at home and report readings at or above 140/90.

## 2017-02-13 NOTE — Assessment & Plan Note (Signed)
Very representative to psoriasis. Will treat with triamcinolone ointment, she will update if no improvement.

## 2017-02-14 ENCOUNTER — Other Ambulatory Visit: Payer: Self-pay | Admitting: Primary Care

## 2017-02-14 ENCOUNTER — Other Ambulatory Visit (INDEPENDENT_AMBULATORY_CARE_PROVIDER_SITE_OTHER): Payer: Medicare HMO

## 2017-02-14 DIAGNOSIS — E785 Hyperlipidemia, unspecified: Secondary | ICD-10-CM

## 2017-02-14 DIAGNOSIS — R7303 Prediabetes: Secondary | ICD-10-CM | POA: Diagnosis not present

## 2017-02-14 DIAGNOSIS — E119 Type 2 diabetes mellitus without complications: Secondary | ICD-10-CM

## 2017-02-14 LAB — COMPREHENSIVE METABOLIC PANEL
ALT: 20 U/L (ref 0–35)
AST: 16 U/L (ref 0–37)
Albumin: 4 g/dL (ref 3.5–5.2)
Alkaline Phosphatase: 76 U/L (ref 39–117)
BUN: 7 mg/dL (ref 6–23)
CALCIUM: 8.8 mg/dL (ref 8.4–10.5)
CHLORIDE: 99 meq/L (ref 96–112)
CO2: 28 meq/L (ref 19–32)
Creatinine, Ser: 0.59 mg/dL (ref 0.40–1.20)
GFR: 109.37 mL/min (ref >60.00)
Glucose, Bld: 210 mg/dL — ABNORMAL HIGH (ref 70–99)
POTASSIUM: 4 meq/L (ref 3.5–5.1)
Sodium: 134 mEq/L — ABNORMAL LOW (ref 135–145)
Total Bilirubin: 0.5 mg/dL (ref 0.2–1.2)
Total Protein: 7.2 g/dL (ref 6.0–8.3)

## 2017-02-14 LAB — LIPID PANEL
CHOL/HDL RATIO: 9
Cholesterol: 266 mg/dL — ABNORMAL HIGH (ref 0–200)
HDL: 30.6 mg/dL — AB (ref >39.00)

## 2017-02-14 LAB — HEMOGLOBIN A1C: HEMOGLOBIN A1C: 9.5 % — AB (ref 4.6–6.5)

## 2017-02-14 LAB — MICROALBUMIN / CREATININE URINE RATIO
CREATININE, U: 129.6 mg/dL
MICROALB UR: 10.7 mg/dL — AB (ref 0.0–1.9)
Microalb Creat Ratio: 8.3 mg/g (ref 0.0–30.0)

## 2017-02-14 LAB — LDL CHOLESTEROL, DIRECT: Direct LDL: 153 mg/dL

## 2017-02-14 MED ORDER — ROSUVASTATIN CALCIUM 5 MG PO TABS: ORAL_TABLET | ORAL | 1 refills | Status: DC

## 2017-02-14 MED ORDER — METFORMIN HCL 1000 MG PO TABS: 1000.0000 mg | ORAL_TABLET | Freq: Two times a day (BID) | ORAL | 1 refills | Status: DC

## 2017-05-02 ENCOUNTER — Other Ambulatory Visit: Payer: Self-pay | Admitting: Primary Care

## 2017-05-02 DIAGNOSIS — E785 Hyperlipidemia, unspecified: Secondary | ICD-10-CM

## 2017-05-02 DIAGNOSIS — E119 Type 2 diabetes mellitus without complications: Secondary | ICD-10-CM

## 2017-05-07 ENCOUNTER — Other Ambulatory Visit (INDEPENDENT_AMBULATORY_CARE_PROVIDER_SITE_OTHER): Payer: Medicare HMO

## 2017-05-07 DIAGNOSIS — E119 Type 2 diabetes mellitus without complications: Secondary | ICD-10-CM

## 2017-05-07 DIAGNOSIS — E785 Hyperlipidemia, unspecified: Secondary | ICD-10-CM

## 2017-05-07 LAB — LIPID PANEL
CHOLESTEROL: 134 mg/dL (ref 0–200)
HDL: 34.9 mg/dL — ABNORMAL LOW (ref >39.00)
NONHDL: 98.82
TRIGLYCERIDES: 276 mg/dL — AB (ref 0.0–149.0)
Total CHOL/HDL Ratio: 4
VLDL: 55.2 mg/dL — ABNORMAL HIGH (ref 0.0–40.0)

## 2017-05-07 LAB — HEMOGLOBIN A1C: Hgb A1c MFr Bld: 7 % — ABNORMAL HIGH (ref 4.6–6.5)

## 2017-05-07 LAB — HEPATIC FUNCTION PANEL
ALBUMIN: 4.7 g/dL (ref 3.5–5.2)
ALK PHOS: 70 U/L (ref 39–117)
ALT: 18 U/L (ref 0–35)
AST: 12 U/L (ref 0–37)
Bilirubin, Direct: 0.1 mg/dL (ref 0.0–0.3)
TOTAL PROTEIN: 7.5 g/dL (ref 6.0–8.3)
Total Bilirubin: 0.6 mg/dL (ref 0.2–1.2)

## 2017-05-07 LAB — LDL CHOLESTEROL, DIRECT: Direct LDL: 73 mg/dL

## 2017-05-15 ENCOUNTER — Encounter: Payer: Self-pay | Admitting: Primary Care

## 2017-05-15 ENCOUNTER — Ambulatory Visit: Payer: Medicare HMO | Admitting: Primary Care

## 2017-05-15 DIAGNOSIS — E785 Hyperlipidemia, unspecified: Secondary | ICD-10-CM | POA: Diagnosis not present

## 2017-05-15 DIAGNOSIS — E119 Type 2 diabetes mellitus without complications: Secondary | ICD-10-CM | POA: Diagnosis not present

## 2017-05-15 NOTE — Assessment & Plan Note (Signed)
A1C of 9.5 in December 2018, recent A1C of 7.0. Commended her on weight loss efforts through diet and exercise.  Continue Metformin 100 mg BID. Continue statin. Urine microalbumin negative in December 2018. Pneumonia vaccination UTD. She will schedule her eye exam.  Follow up in 3 months.

## 2017-05-15 NOTE — Progress Notes (Signed)
Subjective:    Patient ID: Catherine Orr, female    DOB: 03-Nov-1955, 62 y.o.   MRN: [id number]  HPI  [name] is a 62 year old female who presents today for follow up.  1) Type 2 Diabetes:  Current medications include: Metformin 1000 mg BID.   She is not checking her blood sugars.   Last A1C: 7.0 in March 2019, reduced from 9.23 January 2017. Last Eye Exam: Due, she will schedule Last Foot Exam: Due in December 2019 Pneumonia Vaccination: Completed Pneumovax in 2018 ACE/ARB: Urine microalbumin negative in December 2018 Statin: Crestor  Diet currently consists of:  Breakfast: Oatmeal Lunch: Vegetables, salad, meat Dinner: Special K, vegetables, sweet potatoes Snacks: None Desserts: None Beverages: Diet pepsi, un-sweet tea, coffee, water  Exercise: She is walking and stretching   Wt Readings from Last 3 Encounters:  05/15/17 192 lb 12 oz (87.4 kg)  02/13/17 210 lb 1.9 oz (95.3 kg)  11/14/15 205 lb (93 kg)    2) Hyperlipidemia: Recent lipid panel of TC of 134, LDL of 73, Trigs of 276. Lipid panel in December with TC of 266, Trigs of 624, LDL of 153. She is currently managed on rosuvastatin 5 mg once daily, Fish Oil 1000 mg once daily. She has changed her diet as discussed above and is feeling much better.  She denies chest pain, dizziness, shortness of breath, fatigue.    Review of Systems  Constitutional: Negative for fatigue.  Eyes: Negative for visual disturbance.  Respiratory: Negative for shortness of breath.   Cardiovascular: Negative for chest pain.  Neurological: Negative for dizziness, light-headedness and headaches.       Past Medical History:  Diagnosis Date  . 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 Cellulitis and abscess of face   . Hyperglycemia 11/08/2014  . MR (mental retardation)   . Other malaise and fatigue   . Other screening breast examination   . Routine general medical examination at a health care facility   . Screening for  lipoid disorders   . Screening for malignant neoplasm of the cervix   . Special screening for malignant neoplasms, colon      Social History   Socioeconomic History  . Marital status: Single    Spouse name: Not on file  . Number of children: Not on file  . Years of education: Not on file  . Highest education level: Not on file  Occupational History  . Not on file  Social Needs  . Financial resource strain: Not on file  . Food insecurity:    Worry: Not on file    Inability: Not on file  . Transportation needs:    Medical: Not on file    Non-medical: Not on file  Tobacco Use  . Smoking status: Never Smoker  . Smokeless tobacco: Never Used  Substance and Sexual Activity  . Alcohol use: No  . Drug use: No  . Sexual activity: Never  Lifestyle  . Physical activity:    Days per week: Not on file    Minutes per session: Not on file  . Stress: Not on file  Relationships  . Social connections:    Talks on phone: Not on file    Gets together: Not on file    Attends religious service: Not on file    Active member of club or organization: Not on file    Attends meetings of clubs or organizations: Not on file    Relationship status: Not on file  .  Intimate partner violence:    Fear of current or ex partner: Not on file    Emotionally abused: Not on file    Physically abused: Not on file    Forced sexual activity: Not on file  Other Topics Concern  . Not on file  Social History Narrative   Lives in group home    No past surgical history on file.  Family History  Problem Relation Age of Onset  . Diabetes Mother     No Known Allergies  Current Outpatient Medications on File Prior to Visit  Medication Sig Dispense Refill  . metFORMIN (GLUCOPHAGE) 1000 MG tablet Take 1 tablet (1,000 mg total) by mouth 2 (two) times daily with a meal. For diabetes. 180 tablet 1  . rosuvastatin (CRESTOR) 5 MG tablet Take 1 tablet by mouth every evening for cholesterol. 90 tablet 1  .  triamcinolone ointment (KENALOG) 0.5 % Apply 1 application topically 2 (two) times daily. 30 g 0   No current facility-administered medications on file prior to visit.     BP 124/84   Pulse 88   Temp 98 F (36.7 C) (Oral)   Ht 5' 1.5" (1.562 m)   Wt 192 lb 12 oz (87.4 kg)   SpO2 98%   BMI 35.83 kg/m    Objective:   Physical Exam  Constitutional: She appears well-nourished.  Neck: Neck supple.  Cardiovascular: Normal rate and regular rhythm.  Pulmonary/Chest: Effort normal and breath sounds normal.  Skin: Skin is warm and dry.          Assessment & Plan:

## 2017-05-15 NOTE — Patient Instructions (Signed)
Continue to work on M.[employer]. Holdingsyour diet, congratulations on your weight loss!!! I'm so proud of you!  Continue exercising. You should be getting 150 minutes of moderate intensity exercise weekly.   Increase Fish Oil to 1000 mg twice daily.  Continue rosuvastatin 5 mg, metformin 1000 mg BID.   Please schedule a follow up appointment in 6 months.   It was a pleasure to see you today!

## 2017-05-15 NOTE — Assessment & Plan Note (Signed)
Significant improvement with weight loss and with rosuvastatin, continue both. Increase Fish Oil to 1000 mg BID.  LDL at goal.

## 2017-06-27 ENCOUNTER — Ambulatory Visit: Payer: Medicare HMO

## 2017-06-28 ENCOUNTER — Encounter: Payer: Medicare HMO | Admitting: Primary Care

## 2017-07-02 ENCOUNTER — Ambulatory Visit: Payer: Medicare HMO

## 2017-07-02 DIAGNOSIS — H2513 Age-related nuclear cataract, bilateral: Secondary | ICD-10-CM | POA: Diagnosis not present

## 2017-07-02 DIAGNOSIS — H11153 Pinguecula, bilateral: Secondary | ICD-10-CM | POA: Diagnosis not present

## 2017-07-02 DIAGNOSIS — E119 Type 2 diabetes mellitus without complications: Secondary | ICD-10-CM | POA: Diagnosis not present

## 2017-07-04 ENCOUNTER — Encounter: Payer: Medicare HMO | Admitting: Primary Care

## 2017-07-09 IMAGING — CR DG KNEE COMPLETE 4+V*L*
5 series · 5 of 5 positions shown · non-contrast
Comparison: None in PACs

CLINICAL DATA: Three days of left knee pain and swelling without
known trauma the infant is were obtained with the patient recumbent

EXAM:
LEFT KNEE - COMPLETE 4+ VIEW

[view not recorded (1 of 5)]
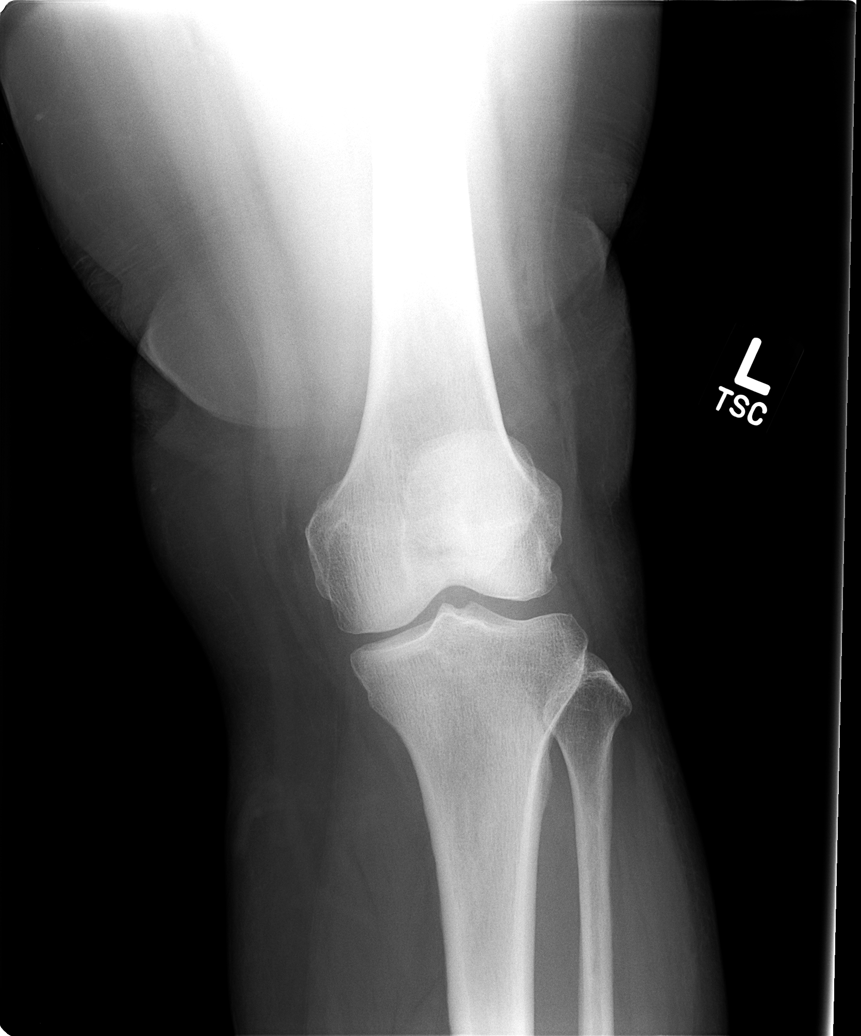

[view not recorded (2 of 5)]
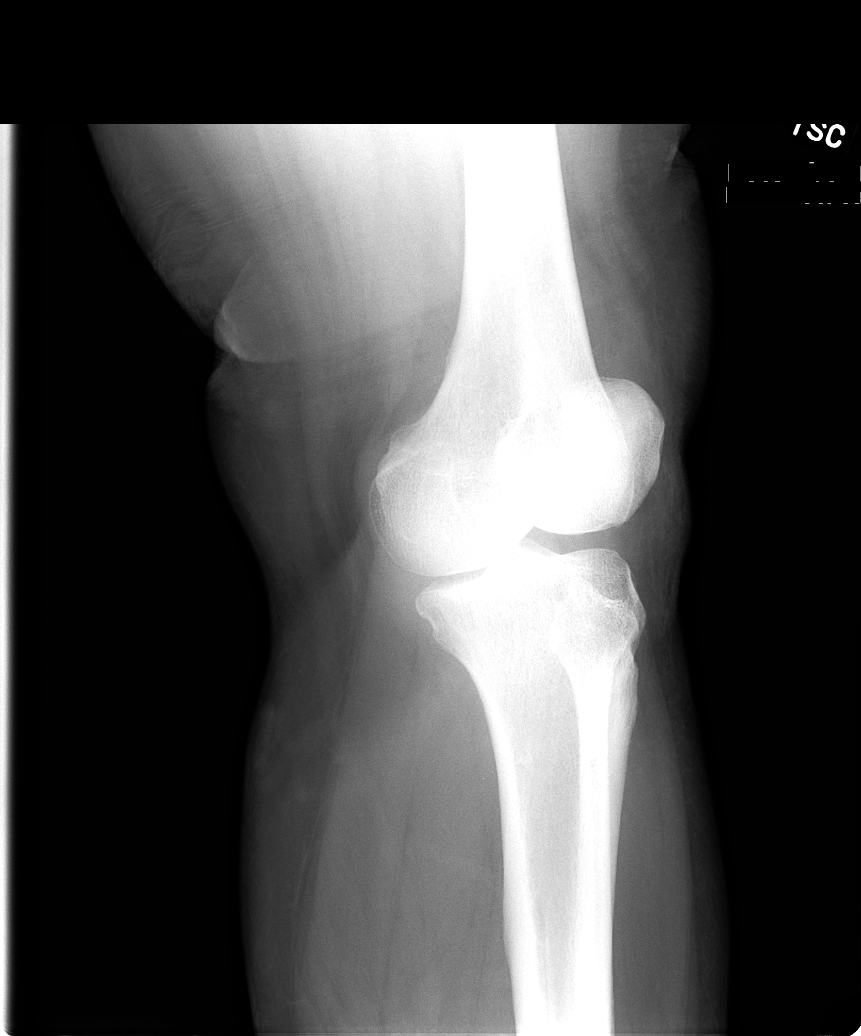

[view not recorded (3 of 5)]
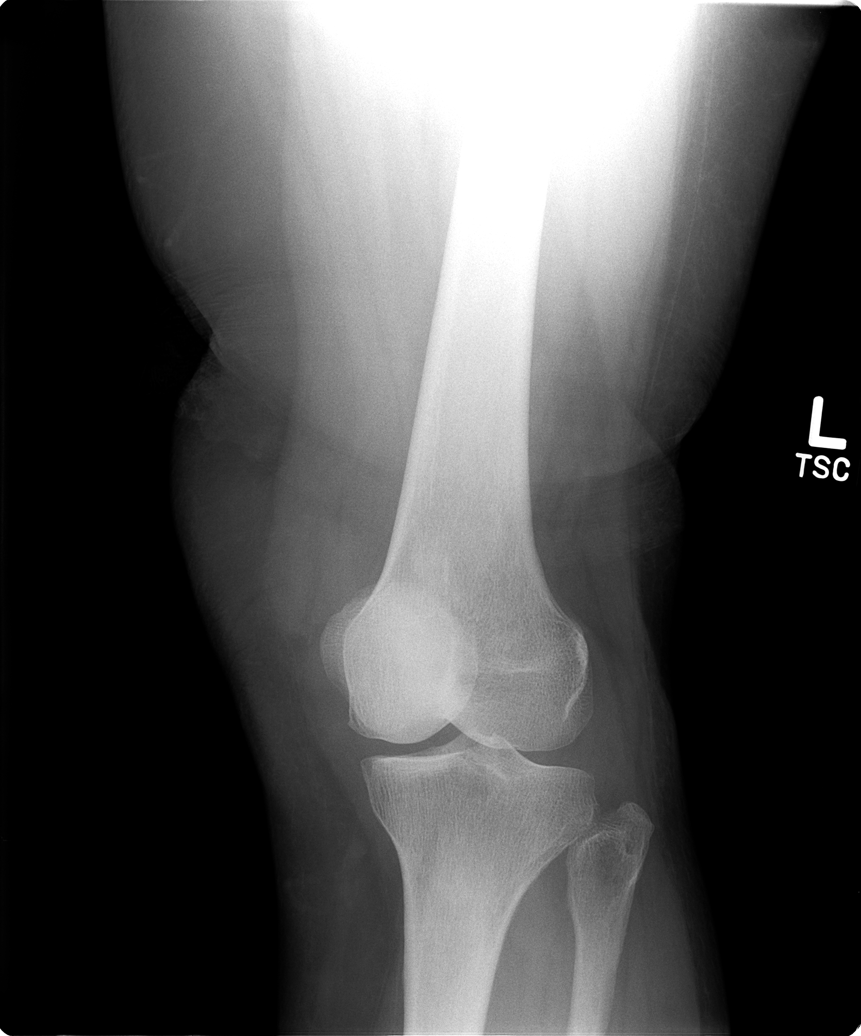

[view not recorded (4 of 5)]
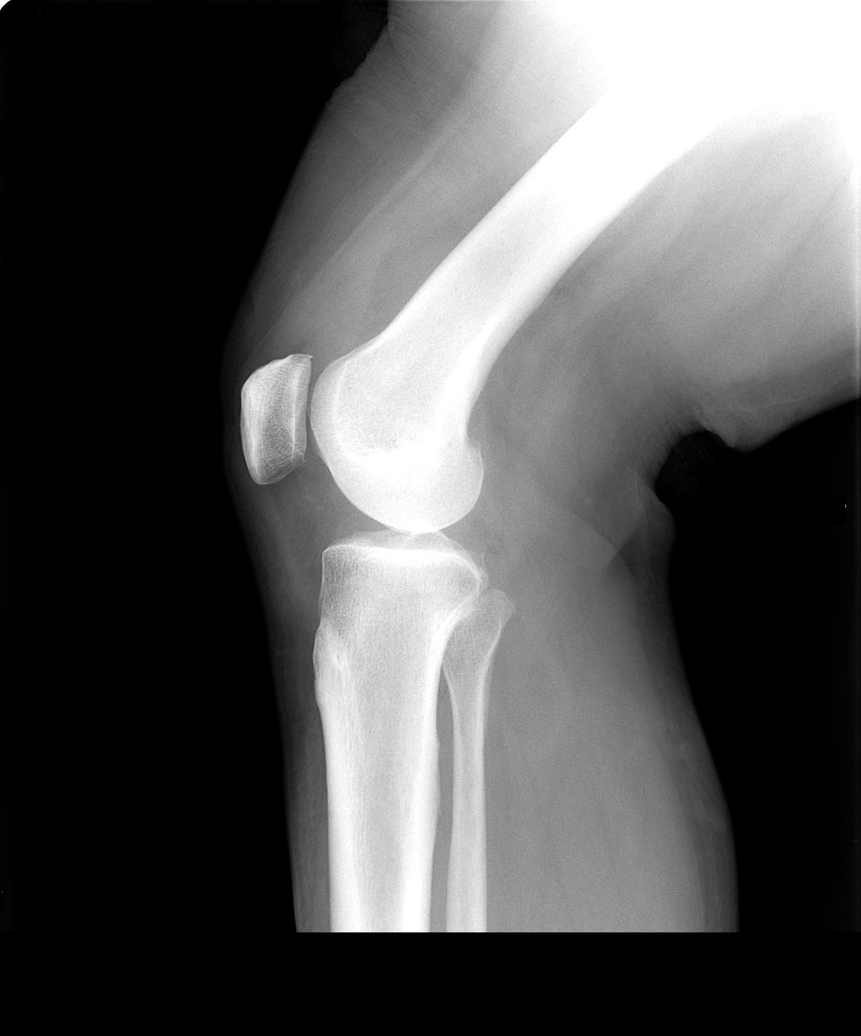

[view not recorded (5 of 5)]
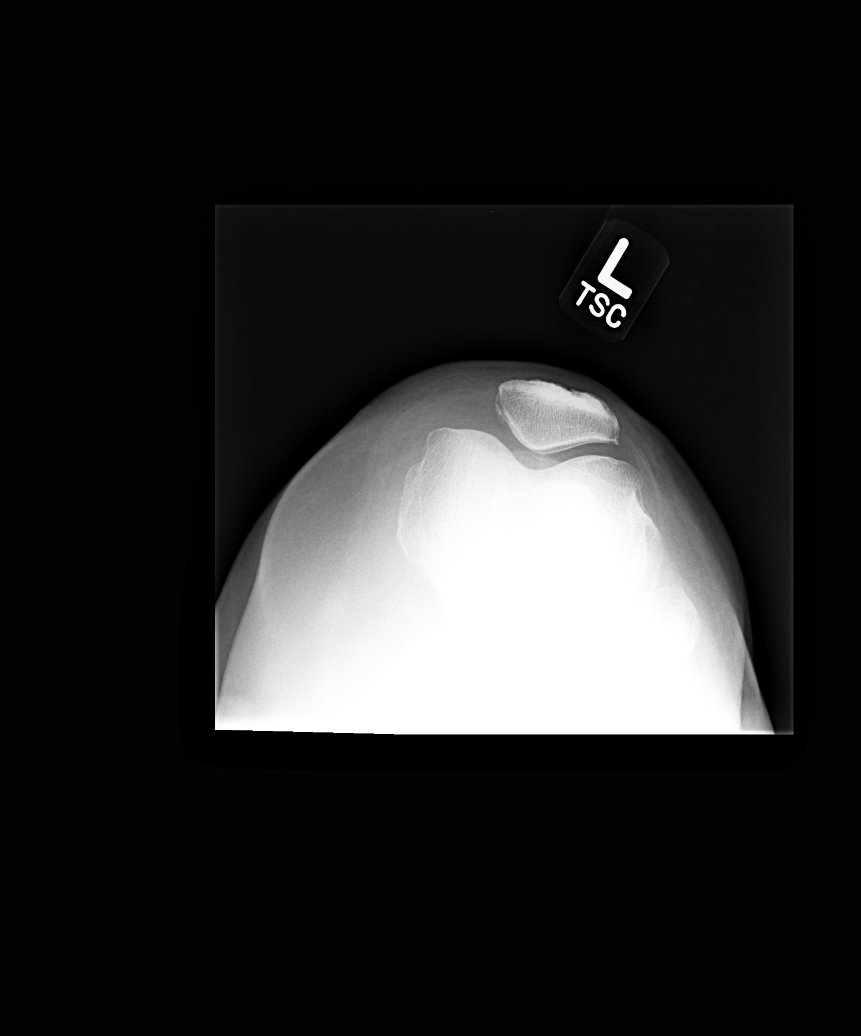

[5 of 5 positions shown; findings below may reference images not displayed]

FINDINGS: The bones of the knee are adequately mineralized. The joint spaces
are reasonably well maintained for the nonweightbearing position.
The proximal fibula is intact. There tiny spurs arising from the
superior and inferior articular margins of the patella and from the
tibial spines. There is no significant joint effusion.
IMPRESSION: There is no acute bony abnormality of the left knee. There are mild
osteoarthritic changes.

## 2017-07-26 ENCOUNTER — Other Ambulatory Visit: Payer: Self-pay | Admitting: Primary Care

## 2017-07-26 DIAGNOSIS — E785 Hyperlipidemia, unspecified: Secondary | ICD-10-CM

## 2017-07-26 DIAGNOSIS — E119 Type 2 diabetes mellitus without complications: Secondary | ICD-10-CM

## 2017-08-11 ENCOUNTER — Other Ambulatory Visit: Payer: Self-pay | Admitting: Primary Care

## 2017-08-11 DIAGNOSIS — E785 Hyperlipidemia, unspecified: Secondary | ICD-10-CM

## 2017-08-11 DIAGNOSIS — E119 Type 2 diabetes mellitus without complications: Secondary | ICD-10-CM

## 2017-08-13 ENCOUNTER — Ambulatory Visit (INDEPENDENT_AMBULATORY_CARE_PROVIDER_SITE_OTHER): Payer: Medicare HMO

## 2017-08-13 VITALS — BP 112/82 | HR 100 | Temp 98.4°F | Ht 61.0 in | Wt 191.2 lb

## 2017-08-13 DIAGNOSIS — Z0000 Encounter for general adult medical examination without abnormal findings: Secondary | ICD-10-CM

## 2017-08-13 DIAGNOSIS — E119 Type 2 diabetes mellitus without complications: Secondary | ICD-10-CM | POA: Diagnosis not present

## 2017-08-13 DIAGNOSIS — E785 Hyperlipidemia, unspecified: Secondary | ICD-10-CM

## 2017-08-13 LAB — LIPID PANEL
CHOL/HDL RATIO: 5
CHOLESTEROL: 179 mg/dL (ref 0–200)
HDL: 38.3 mg/dL — ABNORMAL LOW (ref >39.00)
NonHDL: 140.61
TRIGLYCERIDES: 273 mg/dL — AB (ref 0.0–149.0)
VLDL: 54.6 mg/dL — ABNORMAL HIGH (ref 0.0–40.0)

## 2017-08-13 LAB — BASIC METABOLIC PANEL
BUN: 18 mg/dL (ref 6–23)
CHLORIDE: 102 meq/L (ref 96–112)
CO2: 30 mEq/L (ref 19–32)
Calcium: 9.4 mg/dL (ref 8.4–10.5)
Creatinine, Ser: 0.67 mg/dL (ref 0.40–1.20)
GFR: 94.3 mL/min (ref >60.00)
GLUCOSE: 161 mg/dL — AB (ref 70–99)
Potassium: 4.3 mEq/L (ref 3.5–5.1)
Sodium: 139 mEq/L (ref 135–145)

## 2017-08-13 LAB — HEMOGLOBIN A1C: Hgb A1c MFr Bld: 6.8 % — ABNORMAL HIGH (ref 4.6–6.5)

## 2017-08-13 LAB — LDL CHOLESTEROL, DIRECT: LDL DIRECT: 110 mg/dL

## 2017-08-13 NOTE — Patient Instructions (Signed)
 Preventive Care for Adults  A healthy lifestyle and preventive care can promote health and wellness. Preventive health guidelines for adults include the following key practices.  . A routine yearly physical is a good way to check with your health care provider about your health and preventive screening. It is a chance to share any concerns and updates on your health and to receive a thorough exam.  . Visit your dentist for a routine exam and preventive care every 6 months. Brush your teeth twice a day and floss once a day. Good oral hygiene prevents tooth decay and gum disease.  . The frequency of eye exams is based on your age, health, family medical history, use  of contact lenses, and other factors. Follow your health care provider's recommendations for frequency of eye exams.  . Eat a healthy diet. Foods like vegetables, fruits, whole grains, low-fat dairy products, and lean protein foods contain the nutrients you need without too many calories. Decrease your intake of foods high in solid fats, added sugars, and salt. Eat the right amount of calories for you. Get information about a proper diet from your health care provider, if necessary.  . Regular physical exercise is one of the most important things you can do for your health. Most adults should get at least 150 minutes of moderate-intensity exercise (any activity that increases your heart rate and causes you to sweat) each week. In addition, most adults need muscle-strengthening exercises on 2 or more days a week.  Silver Sneakers may be a benefit available to you. To determine eligibility, you may visit the website: www.silversneakers.com or contact program at 1-866-584-7389 Mon-Fri between 8AM-8PM.   . Maintain a healthy weight. The body mass index (BMI) is a screening tool to identify possible weight problems. It provides an estimate of body fat based on height and weight. Your health care provider can find your BMI and can help you  achieve or maintain a healthy weight.   For adults 20 years and older: ? A BMI below 18.5 is considered underweight. ? A BMI of 18.5 to 24.9 is normal. ? A BMI of 25 to 29.9 is considered overweight. ? A BMI of 30 and above is considered obese.   . Maintain normal blood lipids and cholesterol levels by exercising and minimizing your intake of saturated fat. Eat a balanced diet with plenty of fruit and vegetables. Blood tests for lipids and cholesterol should begin at age 20 and be repeated every 5 years. If your lipid or cholesterol levels are high, you are over 50, or you are at high risk for heart disease, you may need your cholesterol levels checked more frequently. Ongoing high lipid and cholesterol levels should be treated with medicines if diet and exercise are not working.  . If you smoke, find out from your health care provider how to quit. If you do not use tobacco, please do not start.  . If you choose to drink alcohol, please do not consume more than 2 drinks per day. One drink is considered to be 12 ounces (355 mL) of beer, 5 ounces (148 mL) of wine, or 1.5 ounces (44 mL) of liquor.  . If you are 55-79 years old, ask your health care provider if you should take aspirin to prevent strokes.  . Use sunscreen. Apply sunscreen liberally and repeatedly throughout the day. You should seek shade when your shadow is shorter than you. Protect yourself by wearing long sleeves, pants, a wide-brimmed hat, and sunglasses   year round, whenever you are outdoors.  . Once a month, do a whole body skin exam, using a mirror to look at the skin on your back. Tell your health care provider of new moles, moles that have irregular borders, moles that are larger than a pencil eraser, or moles that have changed in shape or color.     

## 2017-08-14 NOTE — Progress Notes (Signed)
Subjective:   Catherine Orr is a 62 y.o. female who presents for Medicare Annual (Subsequent) preventive examination.  Review of Systems:  N/A Cardiac Risk Factors include: dyslipidemia;obesity (BMI >30kg/m2);diabetes mellitus     Objective:     Vitals: BP 112/82 (BP Location: Right Arm, Patient Position: Sitting, Cuff Size: Normal)   Pulse 100   Temp 98.4 F (36.9 C) (Oral)   Ht 5\' 1"  (1.549 m) Comment: no shoes  Wt 191 lb 4 oz (86.8 kg)   SpO2 95%   BMI 36.14 kg/m   Body mass index is 36.14 kg/m.  Advanced Directives 08/13/2017 11/07/2015  Does Patient Have a Medical Advance Directive? Yes Yes  Type of Estate agent of [place];Living will Healthcare Power of Attorney  Does patient want to make changes to medical advance directive? - No - Patient declined  Copy of Healthcare Power of Attorney in Chart? No - copy requested No - copy requested    Tobacco Social History   Tobacco Use  Smoking Status Never Smoker  Smokeless Tobacco Never Used     Counseling given: No   Clinical Intake:  Pre-visit preparation completed: Yes  Pain : No/denies pain Pain Score: 0-No pain     Nutritional Status: BMI > 30  Obese Nutritional Risks: None Diabetes: Yes CBG done?: No Did pt. bring in CBG monitor from home?: No  How often do you need to have someone help you when you read instructions, pamphlets, or other written materials from your doctor or pharmacy?: 1 - Never What is the last grade level you completed in school?: 12th grade  Interpreter Needed?: No  Comments: pt lives alone Information entered by :: LPinson, LPN  Past Medical History:  Diagnosis Date  . 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 Cellulitis and abscess of face   . Hyperglycemia 11/08/2014  . MR (mental retardation)   . Other malaise and fatigue   . Other screening breast examination   . Routine general medical examination at a health care facility   . Screening for  lipoid disorders   . Screening for malignant neoplasm of the cervix   . Special screening for malignant neoplasms, colon    History reviewed. No pertinent surgical history. Family History  Problem Relation Age of Onset  . Diabetes Mother    Social History   Socioeconomic History  . Marital status: Single    Spouse name: Not on file  . Number of children: Not on file  . Years of education: Not on file  . Highest education level: Not on file  Occupational History  . Not on file  Social Needs  . Financial resource strain: Not on file  . Food insecurity:    Worry: Not on file    Inability: Not on file  . Transportation needs:    Medical: Not on file    Non-medical: Not on file  Tobacco Use  . Smoking status: Never Smoker  . Smokeless tobacco: Never Used  Substance and Sexual Activity  . Alcohol use: No  . Drug use: No  . Sexual activity: Not Currently  Lifestyle  . Physical activity:    Days per week: Not on file    Minutes per session: Not on file  . Stress: Not on file  Relationships  . Social connections:    Talks on phone: Not on file    Gets together: Not on file    Attends religious service: Not on file  Active member of club or organization: Not on file    Attends meetings of clubs or organizations: Not on file    Relationship status: Not on file  Other Topics Concern  . Not on file  Social History Narrative   Lives in group home    Outpatient Encounter Medications as of 08/13/2017  Medication Sig  . metFORMIN (GLUCOPHAGE) 1000 MG tablet TAKE 1 TABLET BY MOUTH TWICE A DAY WITH A MEAL FOR DIABETES  . rosuvastatin (CRESTOR) 5 MG tablet TAKE 1 TABLET BY MOUTH ONCE EVERY EVENING FOR CHOLESTEROL  . triamcinolone ointment (KENALOG) 0.5 % Apply 1 application topically 2 (two) times daily.   No facility-administered encounter medications on file as of 08/13/2017.     Activities of Daily Living In your present state of health, do you have any difficulty  performing the following activities: 08/13/2017  Hearing? N  Vision? N  Difficulty concentrating or making decisions? Y  Walking or climbing stairs? N  Dressing or bathing? N  Doing errands, shopping? Y  Preparing Food and eating ? N  Using the Toilet? N  In the past six months, have you accidently leaked urine? N  Do you have problems with loss of bowel control? N  Managing your Medications? N  Managing your Finances? N  Housekeeping or managing your Housekeeping? N  Some recent data might be hidden    Patient Care Team: Doreene Nest, NP as PCP - General (Internal Medicine) Hart Carwin, MD (Inactive) (Gastroenterology)    Assessment:   This is a routine wellness examination for [name].   Hearing Screening   125Hz  250Hz  500Hz  1000Hz  2000Hz  3000Hz  4000Hz  6000Hz  8000Hz   Right ear:   40 40 40  40    Left ear:   40 40 40  40    Vision Screening Comments: Vision exam in April 2019 with Dr. Dione Booze   Exercise Activities and Dietary recommendations Current Exercise Habits: Home exercise routine, Type of exercise: walking, Time (Minutes): 15, Frequency (Times/Week): 7, Weekly Exercise (Minutes/Week): 105, Intensity: Mild, Exercise limited by: None identified  Goals    . Patient Stated     Starting 08/13/2017, I will continue to take medications as prescribed.         Fall Risk Fall Risk  08/13/2017 11/07/2015 11/08/2014  Falls in the past year? No No No   Depression Screen PHQ 2/9 Scores 08/13/2017 11/07/2015 11/08/2014  PHQ - 2 Score 0 0 0  PHQ- 9 Score 0 - -     Cognitive Function MMSE - Mini Mental State Exam 08/13/2017 11/07/2015  Orientation to time 5 5  Orientation to Place 5 5  Registration 3 3  Attention/ Calculation 0 0  Recall 3 2  Recall-comments - pt was unable to recall 1 of 3 words  Language- name 2 objects 0 0  Language- repeat 1 1  Language- follow 3 step command 3 3  Language- read & follow direction 0 0  Write a sentence 0 0  Copy design 0 0    Total score 20 19     PLEASE NOTE: A Mini-Cog screen was completed. Maximum score is 20. A value of 0 denotes this part of Folstein MMSE was not completed or the patient failed this part of the Mini-Cog screening.   Mini-Cog Screening Orientation to Time - Max 5 pts Orientation to Place - Max 5 pts Registration - Max 3 pts Recall - Max 3 pts Language Repeat - Max 1 pts Language Follow  3 Step Command - Max 3 pts     Immunization History  Administered Date(s) Administered  . Influenza,inj,Quad PF,6+ Mos 11/08/2014, 11/07/2015  . Influenza-Unspecified 11/16/2013  . Pneumococcal Polysaccharide-23 02/13/2017  . Td 06/08/2009    Screening Tests Health Maintenance  Topic Date Due  . MAMMOGRAM  08/14/2018 (Originally 07/05/2011)  . INFLUENZA VACCINE  09/19/2017  . PAP SMEAR  11/07/2017  . HEMOGLOBIN A1C  02/12/2018  . FOOT EXAM  02/13/2018  . URINE MICROALBUMIN  02/14/2018  . OPHTHALMOLOGY EXAM  05/21/2018  . TETANUS/TDAP  06/09/2019  . COLONOSCOPY  08/25/2019  . PNEUMOCOCCAL POLYSACCHARIDE VACCINE (2) 02/13/2022  . Hepatitis C Screening  Completed  . HIV Screening  Completed       Plan:     I have personally reviewed, addressed, and noted the following in the patient's chart:  A. Medical and social history B. Use of alcohol, tobacco or illicit drugs  C. Current medications and supplements D. Functional ability and status E.  Nutritional status F.  Physical activity G. Advance directives H. List of other physicians I.  Hospitalizations, surgeries, and ER visits in previous 12 months J.  Vitals K. Screenings to include hearing, vision, cognitive, depression L. Referrals and appointments - none  In addition, I have reviewed and discussed with patient certain preventive protocols, quality metrics, and best practice recommendations. A written personalized care plan for preventive services as well as general preventive health recommendations were provided to  patient.  See attached scanned questionnaire for additional information.   Signed,   Randa EvensLesia Timothea Bodenheimer, MHA, BS, LPN Health Coach

## 2017-08-14 NOTE — Progress Notes (Signed)
PCP notes:   Health maintenance:  A1C - completed Mammogram - PCP please address at next appt  Abnormal screenings:   None  Patient concerns:   None  Nurse concerns:  None  Next PCP appt:   08/28/17 @ 0900

## 2017-08-28 ENCOUNTER — Encounter: Payer: Self-pay | Admitting: Primary Care

## 2017-08-28 ENCOUNTER — Ambulatory Visit (INDEPENDENT_AMBULATORY_CARE_PROVIDER_SITE_OTHER): Payer: Medicare HMO | Admitting: Primary Care

## 2017-08-28 ENCOUNTER — Other Ambulatory Visit (HOSPITAL_COMMUNITY)
Admission: RE | Admit: 2017-08-28 | Discharge: 2017-08-28 | Disposition: A | Discharge status: Home / Self Care | Payer: Medicare HMO | Source: Ambulatory Visit | Attending: Family Medicine | Admitting: Family Medicine

## 2017-08-28 VITALS — BP 134/84 | HR 94 | Temp 98.1°F | Ht 61.0 in | Wt 191.5 lb

## 2017-08-28 DIAGNOSIS — Z124 Encounter for screening for malignant neoplasm of cervix: Secondary | ICD-10-CM

## 2017-08-28 DIAGNOSIS — R030 Elevated blood-pressure reading, without diagnosis of hypertension: Secondary | ICD-10-CM

## 2017-08-28 DIAGNOSIS — Z1231 Encounter for screening mammogram for malignant neoplasm of breast: Secondary | ICD-10-CM | POA: Diagnosis not present

## 2017-08-28 DIAGNOSIS — E785 Hyperlipidemia, unspecified: Secondary | ICD-10-CM

## 2017-08-28 DIAGNOSIS — Z0000 Encounter for general adult medical examination without abnormal findings: Secondary | ICD-10-CM

## 2017-08-28 DIAGNOSIS — E119 Type 2 diabetes mellitus without complications: Secondary | ICD-10-CM

## 2017-08-28 DIAGNOSIS — Z1239 Encounter for other screening for malignant neoplasm of breast: Secondary | ICD-10-CM

## 2017-08-28 LAB — CYTOLOGY - PAP: Diagnosis: NEGATIVE

## 2017-08-28 NOTE — Assessment & Plan Note (Signed)
Recent A1C of 6.8 which is an improvement. Continue Metformin 1000 mg BID. Urine microalbumin due in 6 months. Managed on statin, repeat LDL in 6 months.  Foot exam due in December 2019. Recommended to work on diet and exercise. Follow up in 6 months.

## 2017-08-28 NOTE — Assessment & Plan Note (Signed)
Immunizations UTD per patient. Colonoscopy due in 2021. Pap smear due, completed today. Mammogram due, ordered. Discussed the importance of a healthy diet and regular exercise in order for weight loss, and to reduce the risk of any potential medical problems. Exam unremarkable. Labs overall stable.  Follow up in 1 year for CPE.

## 2017-08-28 NOTE — Patient Instructions (Signed)
Continue exercising. You should be getting 150 minutes of exercise weekly.  Increase vegetables, fruit, whole grains, lean protein.  Ensure you are consuming 64 ounces of water daily.  Call the [facility] to schedule your mammogram.  We will call you regarding your pap smear results.  Please schedule a follow up appointment in 6 months for diabetes check.   It was a pleasure to see you today!  Preventive Care 40-64 Years, Female Preventive care refers to lifestyle choices and visits with your health care provider that can promote health and wellness. What does preventive care include?  A yearly physical exam. This is also called an annual well check.  Dental exams once or twice a year.  Routine eye exams. Ask your health care provider how often you should have your eyes checked.  Personal lifestyle choices, including: ? Daily care of your teeth and gums. ? Regular physical activity. ? Eating a healthy diet. ? Avoiding tobacco and drug use. ? Limiting alcohol use. ? Practicing safe sex. ? Taking low-dose aspirin daily starting at age 81. ? Taking vitamin and mineral supplements as recommended by your health care provider. What happens during an annual well check? The services and screenings done by your health care provider during your annual well check will depend on your age, overall health, lifestyle risk factors, and family history of disease. Counseling Your health care provider may ask you questions about your:  Alcohol use.  Tobacco use.  Drug use.  Emotional well-being.  Home and relationship well-being.  Sexual activity.  Eating habits.  Work and work Statistician.  Method of birth control.  Menstrual cycle.  Pregnancy history.  Screening You may have the following tests or measurements:  Height, weight, and BMI.  Blood pressure.  Lipid and cholesterol levels. These may be checked every 5 years, or more frequently if you are over  49 years old.  Skin check.  Lung cancer screening. You may have this screening every year starting at age 4 if you have a 30-pack-year history of smoking and currently smoke or have quit within the past 15 years.  Fecal occult blood test (FOBT) of the stool. You may have this test every year starting at age 85.  Flexible sigmoidoscopy or colonoscopy. You may have a sigmoidoscopy every 5 years or a colonoscopy every 10 years starting at age 23.  Hepatitis C blood test.  Hepatitis B blood test.  Sexually transmitted disease (STD) testing.  Diabetes screening. This is done by checking your blood sugar (glucose) after you have not eaten for a while (fasting). You may have this done every 1-3 years.  Mammogram. This may be done every 1-2 years. Talk to your health care provider about when you should start having regular mammograms. This may depend on whether you have a family history of breast cancer.  BRCA-related cancer screening. This may be done if you have a family history of breast, ovarian, tubal, or peritoneal cancers.  Pelvic exam and Pap test. This may be done every 3 years starting at age 68. Starting at age 63, this may be done every 5 years if you have a Pap test in combination with an HPV test.  Bone density scan. This is done to screen for osteoporosis. You may have this scan if you are at high risk for osteoporosis.  Discuss your test results, treatment options, and if necessary, the need for more tests with your health care provider. Vaccines Your health care provider may recommend certain  vaccines, such as:  Influenza vaccine. This is recommended every year.  Tetanus, diphtheria, and acellular pertussis (Tdap, Td) vaccine. You may need a Td booster every 10 years.  Varicella vaccine. You may need this if you have not been vaccinated.  Zoster vaccine. You may need this after age 4.  Measles, mumps, and rubella (MMR) vaccine. You may need at least one dose of MMR if  you were born in 1957 or later. You may also need a second dose.  Pneumococcal 13-valent conjugate (PCV13) vaccine. You may need this if you have certain conditions and were not previously vaccinated.  Pneumococcal polysaccharide (PPSV23) vaccine. You may need one or two doses if you smoke cigarettes or if you have certain conditions.  Meningococcal vaccine. You may need this if you have certain conditions.  Hepatitis A vaccine. You may need this if you have certain conditions or if you travel or work in places where you may be exposed to hepatitis A.  Hepatitis B vaccine. You may need this if you have certain conditions or if you travel or work in places where you may be exposed to hepatitis B.  Haemophilus influenzae type b (Hib) vaccine. You may need this if you have certain conditions.  Talk to your health care provider about which screenings and vaccines you need and how often you need them. This information is not intended to replace advice given to you by your health care provider. Make sure you discuss any questions you have with your health care provider. Document Released: 03/04/2015 Document Revised: 10/26/2015 Document Reviewed: 12/07/2014 Elsevier Interactive Patient Education  2018 [employer]. ould be getting 150 minutes of moderate intensity exercise weekly.

## 2017-08-28 NOTE — Progress Notes (Signed)
Subjective:    Patient ID: Catherine Orr, female    DOB: 1956-02-11, 62 y.o.   MRN: [id number]  HPI  Catherine Orr is a 62 year old female who presents today for complete physical.  Immunizations: -Tetanus: Completed in 2011 -Influenza: Completed last season -Pneumonia: Completed Pneumovax in 2018 -Shingles: Completed   Diet: She endorses a healthy diet.  Breakfast: Cereal Lunch: Green beans, corn Dinner: Meat, vegetables, starch, beans Snacks: Cereal  Desserts: None Beverages: Diet Pepsi, un-sweet tea, water  Exercise: Walking 3-4 days weekly Eye exam: Completed in April 2019 Dental exam: Completes semi-annually  Colonoscopy: Completed in 2011 Pap Smear: Completed in 2016 Mammogram: Completed in 2011 Hep C Screen: Completed in 2016  The 10-year ASCVD risk score Denman George DC Jr., et al., 2013) is: 10.5%   Values used to calculate the score:     Age: 36 years     Sex: Female     Is Non-Hispanic African American: No     Diabetic: Yes     Tobacco smoker: No     Systolic Blood Pressure: 134 mmHg     Is BP treated: No     HDL Cholesterol: 38.3 mg/dL     Total Cholesterol: 179 mg/dL    Review of Systems  Constitutional: Negative for unexpected weight change.  HENT: Negative for rhinorrhea.   Respiratory: Negative for cough and shortness of breath.   Cardiovascular: Negative for chest pain.  Gastrointestinal: Negative for constipation and diarrhea.  Genitourinary: Negative for difficulty urinating and menstrual problem.  Musculoskeletal: Negative for arthralgias and myalgias.  Skin: Negative for rash.  Allergic/Immunologic: Negative for environmental allergies.  Neurological: Negative for dizziness, numbness and headaches.       Past Medical History:  Diagnosis Date  . 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 Cellulitis and abscess of face   . Hyperglycemia 11/08/2014  . MR (mental retardation)   . Other malaise and fatigue   . Other screening breast  examination   . Routine general medical examination at a health care facility   . Screening for lipoid disorders   . Screening for malignant neoplasm of the cervix   . Special screening for malignant neoplasms, colon      Social History   Socioeconomic History  . Marital status: Single    Spouse name: Not on file  . Number of children: Not on file  . Years of education: Not on file  . Highest education level: Not on file  Occupational History  . Not on file  Social Needs  . Financial resource strain: Not on file  . Food insecurity:    Worry: Not on file    Inability: Not on file  . Transportation needs:    Medical: Not on file    Non-medical: Not on file  Tobacco Use  . Smoking status: Never Smoker  . Smokeless tobacco: Never Used  Substance and Sexual Activity  . Alcohol use: No  . Drug use: No  . Sexual activity: Not Currently  Lifestyle  . Physical activity:    Days per week: Not on file    Minutes per session: Not on file  . Stress: Not on file  Relationships  . Social connections:    Talks on phone: Not on file    Gets together: Not on file    Attends religious service: Not on file    Active member of club or organization: Not on file    Attends meetings of clubs or  organizations: Not on file    Relationship status: Not on file  . Intimate partner violence:    Fear of current or ex partner: Not on file    Emotionally abused: Not on file    Physically abused: Not on file    Forced sexual activity: Not on file  Other Topics Concern  . Not on file  Social History Narrative   Lives in group home    No past surgical history on file.  Family History  Problem Relation Age of Onset  . Diabetes Mother     No Known Allergies  Current Outpatient Medications on File Prior to Visit  Medication Sig Dispense Refill  . metFORMIN (GLUCOPHAGE) 1000 MG tablet TAKE 1 TABLET BY MOUTH TWICE A DAY WITH A MEAL FOR DIABETES 180 tablet 1  . rosuvastatin (CRESTOR) 5 MG  tablet TAKE 1 TABLET BY MOUTH ONCE EVERY EVENING FOR CHOLESTEROL 90 tablet 1  . triamcinolone ointment (KENALOG) 0.5 % Apply 1 application topically 2 (two) times daily. 30 g 0   No current facility-administered medications on file prior to visit.     BP 134/84   Pulse 94   Temp 98.1 F (36.7 C) (Oral)   Ht 5\' 1"  (1.549 m)   Wt 191 lb 8 oz (86.9 kg)   SpO2 98%   BMI 36.18 kg/m    Objective:   Physical Exam  Constitutional: She is oriented to person, place, and time. She appears well-nourished.  HENT:  Mouth/Throat: No oropharyngeal exudate.  Eyes: Pupils are equal, round, and reactive to light. EOM are normal.  Neck: Neck supple. No thyromegaly present.  Cardiovascular: Normal rate and regular rhythm.  Respiratory: Effort normal and breath sounds normal.  GI: Soft. Bowel sounds are normal. There is no tenderness.  Genitourinary: There is no tenderness or lesion on the right labia. There is no tenderness or lesion on the left labia. Cervix exhibits no motion tenderness and no discharge. Right adnexum displays no tenderness. Left adnexum displays no tenderness. No erythema in the vagina. No vaginal discharge found.  Musculoskeletal: Normal range of motion.  Neurological: She is alert and oriented to person, place, and time.  Skin: Skin is warm and dry.  Psychiatric: She has a normal mood and affect.           Assessment & Plan:

## 2017-08-28 NOTE — Assessment & Plan Note (Signed)
Borderline today, continue to monitor.  

## 2017-08-28 NOTE — Assessment & Plan Note (Signed)
LDL at goal 3 months ago, now slightly above goal. She is compliant to Crestor. Will repeat in 6 months. Encouraged to work on diet and exercise.

## 2017-08-28 NOTE — Progress Notes (Signed)
I reviewed health advisor's note, was available for consultation, and agree with documentation and plan.  

## 2017-08-29 ENCOUNTER — Encounter: Payer: Self-pay | Admitting: *Deleted

## 2017-09-05 DIAGNOSIS — H52223 Regular astigmatism, bilateral: Secondary | ICD-10-CM | POA: Diagnosis not present

## 2017-09-05 DIAGNOSIS — H524 Presbyopia: Secondary | ICD-10-CM | POA: Diagnosis not present

## 2018-04-05 ENCOUNTER — Other Ambulatory Visit: Payer: Self-pay | Admitting: Primary Care

## 2018-04-05 DIAGNOSIS — E785 Hyperlipidemia, unspecified: Secondary | ICD-10-CM

## 2018-08-25 ENCOUNTER — Other Ambulatory Visit: Payer: Self-pay | Admitting: Primary Care

## 2018-08-25 DIAGNOSIS — E119 Type 2 diabetes mellitus without complications: Secondary | ICD-10-CM

## 2018-08-28 ENCOUNTER — Other Ambulatory Visit: Payer: Self-pay | Admitting: Primary Care

## 2018-08-28 DIAGNOSIS — E785 Hyperlipidemia, unspecified: Secondary | ICD-10-CM

## 2018-08-28 DIAGNOSIS — E119 Type 2 diabetes mellitus without complications: Secondary | ICD-10-CM

## 2018-09-02 ENCOUNTER — Other Ambulatory Visit (INDEPENDENT_AMBULATORY_CARE_PROVIDER_SITE_OTHER): Payer: Medicare HMO

## 2018-09-02 ENCOUNTER — Other Ambulatory Visit: Payer: Self-pay

## 2018-09-02 ENCOUNTER — Ambulatory Visit: Payer: Medicare HMO

## 2018-09-02 DIAGNOSIS — E785 Hyperlipidemia, unspecified: Secondary | ICD-10-CM | POA: Diagnosis not present

## 2018-09-02 DIAGNOSIS — E119 Type 2 diabetes mellitus without complications: Secondary | ICD-10-CM | POA: Diagnosis not present

## 2018-09-02 LAB — COMPREHENSIVE METABOLIC PANEL
ALT: 20 U/L (ref 0–35)
AST: 22 U/L (ref 0–37)
Albumin: 4.6 g/dL (ref 3.5–5.2)
Alkaline Phosphatase: 76 U/L (ref 39–117)
BUN: 15 mg/dL (ref 6–23)
CO2: 29 mEq/L (ref 19–32)
Calcium: 9.5 mg/dL (ref 8.4–10.5)
Chloride: 101 mEq/L (ref 96–112)
Creatinine, Ser: 0.65 mg/dL (ref 0.40–1.20)
GFR: 91.57 mL/min (ref >60.00)
Glucose, Bld: 201 mg/dL — ABNORMAL HIGH (ref 70–99)
Potassium: 4.6 mEq/L (ref 3.5–5.1)
Sodium: 138 mEq/L (ref 135–145)
Total Bilirubin: 0.5 mg/dL (ref 0.2–1.2)
Total Protein: 7.2 g/dL (ref 6.0–8.3)

## 2018-09-02 LAB — HEMOGLOBIN A1C: Hgb A1c MFr Bld: 8.4 % — ABNORMAL HIGH (ref 4.6–6.5)

## 2018-09-02 LAB — LIPID PANEL
Cholesterol: 153 mg/dL (ref 0–200)
HDL: 39.8 mg/dL (ref >39.00)
NonHDL: 113.03
Total CHOL/HDL Ratio: 4
Triglycerides: 310 mg/dL — ABNORMAL HIGH (ref 0.0–149.0)
VLDL: 62 mg/dL — ABNORMAL HIGH (ref 0.0–40.0)

## 2018-09-02 LAB — LDL CHOLESTEROL, DIRECT: Direct LDL: 84 mg/dL

## 2018-09-02 LAB — MICROALBUMIN / CREATININE URINE RATIO
Creatinine,U: 112.4 mg/dL
Microalb Creat Ratio: 4.7 mg/g (ref 0.0–30.0)
Microalb, Ur: 5.3 mg/dL — ABNORMAL HIGH (ref 0.0–1.9)

## 2018-09-04 ENCOUNTER — Ambulatory Visit (INDEPENDENT_AMBULATORY_CARE_PROVIDER_SITE_OTHER): Payer: Medicare HMO | Admitting: Primary Care

## 2018-09-04 ENCOUNTER — Encounter: Payer: Self-pay | Admitting: Primary Care

## 2018-09-04 ENCOUNTER — Other Ambulatory Visit: Payer: Self-pay

## 2018-09-04 VITALS — BP 130/84 | HR 105 | Temp 98.4°F | Ht 61.0 in | Wt 188.2 lb

## 2018-09-04 DIAGNOSIS — Z23 Encounter for immunization: Secondary | ICD-10-CM

## 2018-09-04 DIAGNOSIS — Z1239 Encounter for other screening for malignant neoplasm of breast: Secondary | ICD-10-CM | POA: Diagnosis not present

## 2018-09-04 DIAGNOSIS — Z0000 Encounter for general adult medical examination without abnormal findings: Secondary | ICD-10-CM | POA: Diagnosis not present

## 2018-09-04 DIAGNOSIS — E785 Hyperlipidemia, unspecified: Secondary | ICD-10-CM

## 2018-09-04 DIAGNOSIS — E119 Type 2 diabetes mellitus without complications: Secondary | ICD-10-CM | POA: Diagnosis not present

## 2018-09-04 MED ORDER — ZOSTER VAC RECOMB ADJUVANTED 50 MCG/0.5ML IM SUSR: 0.5000 mL | Freq: Once | INTRAMUSCULAR | 1 refills | Status: DC

## 2018-09-04 MED ORDER — ZOSTER VAC RECOMB ADJUVANTED 50 MCG/0.5ML IM SUSR: 0.5000 mL | Freq: Once | INTRAMUSCULAR | 1 refills | Status: AC

## 2018-09-04 NOTE — Patient Instructions (Signed)
Start exercising. You should be getting 150 minutes of exercise weekly.  It is important that you improve your diet. Please limit carbohydrates in the form of white bread, rice, pasta, sweets, fast food, fried food, sugary drinks, etc. Increase your consumption of fresh fruits and vegetables, whole grains, lean protein.  Ensure you are consuming 64 ounces of water daily.  Take the shingles vaccination to your pharmacy for administration.  Please schedule a follow up appointment in 3 months for diabetes check.  It was a pleasure to see you today!   Preventive Care 65-13 Years Old, Female Preventive care refers to visits with your health care provider and lifestyle choices that can promote health and wellness. This includes:  A yearly physical exam. This may also be called an annual well check.  Regular dental visits and eye exams.  Immunizations.  Screening for certain conditions.  Healthy lifestyle choices, such as eating a healthy diet, getting regular exercise, not using drugs or products that contain nicotine and tobacco, and limiting alcohol use. What can I expect for my preventive care visit? Physical exam Your health care provider will check your:  Height and weight. This may be used to calculate body mass index (BMI), which tells if you are at a healthy weight.  Heart rate and blood pressure.  Skin for abnormal spots. Counseling Your health care provider may ask you questions about your:  Alcohol, tobacco, and drug use.  Emotional well-being.  Home and relationship well-being.  Sexual activity.  Eating habits.  Work and work Statistician.  Method of birth control.  Menstrual cycle.  Pregnancy history. What immunizations do I need?  Influenza (flu) vaccine  This is recommended every year. Tetanus, diphtheria, and pertussis (Tdap) vaccine  You may need a Td booster every 10 years. Varicella (chickenpox) vaccine  You may need this if you have not  been vaccinated. Zoster (shingles) vaccine  You may need this after age 65. Measles, mumps, and rubella (MMR) vaccine  You may need at least one dose of MMR if you were born in 1957 or later. You may also need a second dose. Pneumococcal conjugate (PCV13) vaccine  You may need this if you have certain conditions and were not previously vaccinated. Pneumococcal polysaccharide (PPSV23) vaccine  You may need one or two doses if you smoke cigarettes or if you have certain conditions. Meningococcal conjugate (MenACWY) vaccine  You may need this if you have certain conditions. Hepatitis A vaccine  You may need this if you have certain conditions or if you travel or work in places where you may be exposed to hepatitis A. Hepatitis B vaccine  You may need this if you have certain conditions or if you travel or work in places where you may be exposed to hepatitis B. Haemophilus influenzae type b (Hib) vaccine  You may need this if you have certain conditions. Human papillomavirus (HPV) vaccine  If recommended by your health care provider, you may need three doses over 6 months. You may receive vaccines as individual doses or as more than one vaccine together in one shot (combination vaccines). Talk with your health care provider about the risks and benefits of combination vaccines. What tests do I need? Blood tests  Lipid and cholesterol levels. These may be checked every 5 years, or more frequently if you are over 46 years old.  Hepatitis C test.  Hepatitis B test. Screening  Lung cancer screening. You may have this screening every year starting at age 76 if  you have a 30-pack-year history of smoking and currently smoke or have quit within the past 15 years.  Colorectal cancer screening. All adults should have this screening starting at age 21 and continuing until age 85. Your health care provider may recommend screening at age 77 if you are at increased risk. You will have tests  every 1-10 years, depending on your results and the type of screening test.  Diabetes screening. This is done by checking your blood sugar (glucose) after you have not eaten for a while (fasting). You may have this done every 1-3 years.  Mammogram. This may be done every 1-2 years. Talk with your health care provider about when you should start having regular mammograms. This may depend on whether you have a family history of breast cancer.  BRCA-related cancer screening. This may be done if you have a family history of breast, ovarian, tubal, or peritoneal cancers.  Pelvic exam and Pap test. This may be done every 3 years starting at age 65. Starting at age 65, this may be done every 5 years if you have a Pap test in combination with an HPV test. Other tests  Sexually transmitted disease (STD) testing.  Bone density scan. This is done to screen for osteoporosis. You may have this scan if you are at high risk for osteoporosis. Follow these instructions at home: Eating and drinking  Eat a diet that includes fresh fruits and vegetables, whole grains, lean protein, and low-fat dairy.  Take vitamin and mineral supplements as recommended by your health care provider.  Do not drink alcohol if: ? Your health care provider tells you not to drink. ? You are pregnant, may be pregnant, or are planning to become pregnant.  If you drink alcohol: ? Limit how much you have to 0-1 drink a day. ? Be aware of how much alcohol is in your drink. In the [place] one drink equals one 12 oz bottle of beer (355 mL), one 5 oz glass of wine (148 mL), or one 1 oz glass of hard liquor (44 mL). Lifestyle  Take daily care of your teeth and gums.  Stay active. Exercise for at least 30 minutes on 5 or more days each week.  Do not use any products that contain nicotine or tobacco, such as cigarettes, e-cigarettes, and chewing tobacco. If you need help quitting, ask your health care provider.  If you are sexually  active, practice safe sex. Use a condom or other form of birth control (contraception) in order to prevent pregnancy and STIs (sexually transmitted infections).  If told by your health care provider, take low-dose aspirin daily starting at age 29. What's next?  Visit your health care provider once a year for a well check visit.  Ask your health care provider how often you should have your eyes and teeth checked.  Stay up to date on all vaccines. This information is not intended to replace advice given to you by your health care provider. Make sure you discuss any questions you have with your health care provider. Document Released: 03/04/2015 Document Revised: 10/17/2017 Document Reviewed: 10/17/2017 Elsevier Patient Education  2020 [employer].

## 2018-09-04 NOTE — Assessment & Plan Note (Signed)
Increase in A1C from 6.8 to 8.4, discussed this with patient today.  She kindly declines further treatment for diabetes as she wants to work on lifestyle changes. We will have her continue Metformin for now, repeat A1C in 3 months. If A1C above goal in 3 months then we will add in Glipizide.  Managed on statin. Urine microalbumin negative. Foot exam today. Eye exam due next month. Pneumonia vaccination UTD.  Follow up in 3 months.

## 2018-09-04 NOTE — Assessment & Plan Note (Signed)
LDL at goal.  Continue rosuvastatin. 

## 2018-09-04 NOTE — Assessment & Plan Note (Signed)
Immunizations UTD. Rx for Shingrix provided to take to the pharmacy. Mammogram due, pending. Pap smear UTD. Colonoscopy due in 2021. Strongly advised she work on diet, start exercising.  Exam stable. Labs reviewed. Follow up in 1 year.

## 2018-09-04 NOTE — Progress Notes (Signed)
Subjective:    Patient ID: Catherine Orr, female    DOB: 1955/09/27, 63 y.o.   MRN: [id number]  HPI  Catherine Orr is a 63 year old female who presents today for complete physical.  Immunizations: -Tetanus: Completed in 2011 -Influenza: Due this season -Pneumonia: Completed in 2018 -Shingles: Completed previously.   Diet:   Diet currently consists of:  Breakfast: Oatmeal, grits Lunch: Take out food Dinner: [name], sandwich  Snacks: None Desserts: Sugar free cookies 2-3 times weekly Beverages: Diet soda, water  Exercise: She is not exercising  Eye exam: Due in August 2020 Dental exam: Completes Colonoscopy: Completed in 2011, due in 2021 Pap Smear: Completed in 2019 Mammogram: Due Hep C Screen: Negative  BP Readings from Last 3 Encounters:  09/04/18 130/84  08/28/17 134/84  08/13/17 112/82   Wt Readings from Last 3 Encounters:  09/04/18 188 lb 4 oz (85.4 kg)  08/28/17 191 lb 8 oz (86.9 kg)  08/13/17 191 lb 4 oz (86.8 kg)     Review of Systems  Constitutional: Negative for unexpected weight change.  HENT: Negative for rhinorrhea.   Respiratory: Negative for cough and shortness of breath.   Cardiovascular: Negative for chest pain.  Gastrointestinal: Negative for constipation and diarrhea.  Genitourinary: Negative for difficulty urinating.  Musculoskeletal: Negative for arthralgias and myalgias.  Skin: Negative for rash.  Allergic/Immunologic: Negative for environmental allergies.  Neurological: Negative for dizziness, numbness and headaches.  Psychiatric/Behavioral: The patient is not nervous/anxious.        Past Medical History:  Diagnosis Date  . 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 Cellulitis and abscess of face   . Hyperglycemia 11/08/2014  . MR (mental retardation)   . Other malaise and fatigue   . Other screening breast examination   . Routine general medical examination at a health care facility   . Screening for lipoid disorders   .  Screening for malignant neoplasm of the cervix   . Special screening for malignant neoplasms, colon      Social History   Socioeconomic History  . Marital status: Single    Spouse name: Not on file  . Number of children: Not on file  . Years of education: Not on file  . Highest education level: Not on file  Occupational History  . Not on file  Social Needs  . Financial resource strain: Not on file  . Food insecurity    Worry: Not on file    Inability: Not on file  . Transportation needs    Medical: Not on file    Non-medical: Not on file  Tobacco Use  . Smoking status: Never Smoker  . Smokeless tobacco: Never Used  Substance and Sexual Activity  . Alcohol use: No  . Drug use: No  . Sexual activity: Not Currently  Lifestyle  . Physical activity    Days per week: Not on file    Minutes per session: Not on file  . Stress: Not on file  Relationships  . Social Herbalist on phone: Not on file    Gets together: Not on file    Attends religious service: Not on file    Active member of club or organization: Not on file    Attends meetings of clubs or organizations: Not on file    Relationship status: Not on file  . Intimate partner violence    Fear of current or ex partner: Not on file    Emotionally abused: Not  on file    Physically abused: Not on file    Forced sexual activity: Not on file  Other Topics Concern  . Not on file  Social History Narrative   Lives in group home    No past surgical history on file.  Family History  Problem Relation Age of Onset  . Diabetes Mother     No Known Allergies  Current Outpatient Medications on File Prior to Visit  Medication Sig Dispense Refill  . metFORMIN (GLUCOPHAGE) 1000 MG tablet TAKE 1 TABLET BY MOUTH TWICE A DAY WITH A MEAL FOR DIABETES 180 tablet 1  . rosuvastatin (CRESTOR) 5 MG tablet TAKE 1 TABLET BY MOUTH ONCE EVERY EVENING FOR CHOLESTEROL 90 tablet 1  . triamcinolone ointment (KENALOG) 0.5 % Apply  1 application topically 2 (two) times daily. 30 g 0   No current facility-administered medications on file prior to visit.     BP 130/84   Pulse (!) 105   Temp 98.4 F (36.9 C) (Temporal)   Ht 5\' 1"  (1.549 m)   Wt 188 lb 4 oz (85.4 kg)   SpO2 98%   BMI 35.57 kg/m    Objective:   Physical Exam  Constitutional: She is oriented to person, place, and time. She appears well-nourished.  HENT:  Mouth/Throat: No oropharyngeal exudate.  Eyes: Pupils are equal, round, and reactive to light. EOM are normal.  Neck: Neck supple. No thyromegaly present.  Cardiovascular: Normal rate and regular rhythm.  Respiratory: Effort normal and breath sounds normal.  GI: Soft. Bowel sounds are normal. There is no abdominal tenderness.  Musculoskeletal: Normal range of motion.  Neurological: She is alert and oriented to person, place, and time.  Skin: Skin is warm and dry.  Psychiatric: She has a normal mood and affect.           Assessment & Plan:

## 2018-09-23 ENCOUNTER — Ambulatory Visit
Admission: RE | Admit: 2018-09-23 | Discharge: 2018-09-23 | Disposition: A | Discharge status: Home / Self Care | Payer: Medicare HMO | Source: Ambulatory Visit | Attending: Primary Care | Admitting: Primary Care

## 2018-09-23 DIAGNOSIS — Z1231 Encounter for screening mammogram for malignant neoplasm of breast: Secondary | ICD-10-CM | POA: Insufficient documentation

## 2018-09-23 DIAGNOSIS — Z1239 Encounter for other screening for malignant neoplasm of breast: Secondary | ICD-10-CM

## 2018-10-13 DIAGNOSIS — H11153 Pinguecula, bilateral: Secondary | ICD-10-CM | POA: Diagnosis not present

## 2018-10-13 DIAGNOSIS — H2513 Age-related nuclear cataract, bilateral: Secondary | ICD-10-CM | POA: Diagnosis not present

## 2018-10-13 DIAGNOSIS — E119 Type 2 diabetes mellitus without complications: Secondary | ICD-10-CM | POA: Diagnosis not present

## 2018-11-13 ENCOUNTER — Other Ambulatory Visit: Payer: Self-pay | Admitting: Primary Care

## 2018-11-13 DIAGNOSIS — E785 Hyperlipidemia, unspecified: Secondary | ICD-10-CM

## 2018-11-13 DIAGNOSIS — E119 Type 2 diabetes mellitus without complications: Secondary | ICD-10-CM

## 2018-12-08 ENCOUNTER — Ambulatory Visit (INDEPENDENT_AMBULATORY_CARE_PROVIDER_SITE_OTHER): Payer: Medicare HMO | Admitting: Primary Care

## 2018-12-08 ENCOUNTER — Encounter: Payer: Self-pay | Admitting: Primary Care

## 2018-12-08 ENCOUNTER — Other Ambulatory Visit: Payer: Self-pay

## 2018-12-08 VITALS — BP 148/94 | HR 105 | Temp 97.1°F | Ht 61.0 in | Wt 199.1 lb

## 2018-12-08 DIAGNOSIS — E119 Type 2 diabetes mellitus without complications: Secondary | ICD-10-CM | POA: Diagnosis not present

## 2018-12-08 DIAGNOSIS — R030 Elevated blood-pressure reading, without diagnosis of hypertension: Secondary | ICD-10-CM

## 2018-12-08 LAB — POCT GLYCOSYLATED HEMOGLOBIN (HGB A1C): Hemoglobin A1C: 7.8 % — AB (ref 4.0–5.6)

## 2018-12-08 MED ORDER — BLOOD GLUCOSE MONITOR KIT: PACK | 0 refills | Status: DC

## 2018-12-08 NOTE — Patient Instructions (Addendum)
You can check your blood sugars 2-3 times weekly. The best time to check is before any meal, 2 hours after any meal, bedtime.  Please notify me if you see readings consistently at or above 200.  Your blood pressure is too high. Start monitoring your blood pressure daily, around the same time of day, for the next 2-3 weeks.  Ensure that you have rested for 30 minutes prior to checking your blood pressure. Record your readings and notify me if you see readings at or above 135/90 consistently.  Please schedule a follow up appointment in 3 months for diabetes and blood pressure check.  It was a pleasure to see you today!   Diabetes Mellitus and Nutrition, Adult When you have diabetes (diabetes mellitus), it is very important to have healthy eating habits because your blood sugar (glucose) levels are greatly affected by what you eat and drink. Eating healthy foods in the appropriate amounts, at about the same times every day, can help you:  Control your blood glucose.  Lower your risk of heart disease.  Improve your blood pressure.  Reach or maintain a healthy weight. Every person with diabetes is different, and each person has different needs for a meal plan. Your health care provider may recommend that you work with a diet and nutrition specialist (dietitian) to make a meal plan that is best for you. Your meal plan may vary depending on factors such as:  The calories you need.  The medicines you take.  Your weight.  Your blood glucose, blood pressure, and cholesterol levels.  Your activity level.  Other health conditions you have, such as heart or kidney disease. How do carbohydrates affect me? Carbohydrates, also called carbs, affect your blood glucose level more than any other type of food. Eating carbs naturally raises the amount of glucose in your blood. Carb counting is a method for keeping track of how many carbs you eat. Counting carbs is important to keep your blood glucose at  a healthy level, especially if you use insulin or take certain oral diabetes medicines. It is important to know how many carbs you can safely have in each meal. This is different for every person. Your dietitian can help you calculate how many carbs you should have at each meal and for each snack. Foods that contain carbs include:  Bread, cereal, rice, pasta, and crackers.  Potatoes and corn.  Peas, beans, and lentils.  Milk and yogurt.  Fruit and juice.  Desserts, such as cakes, cookies, ice cream, and candy. How does alcohol affect me? Alcohol can cause a sudden decrease in blood glucose (hypoglycemia), especially if you use insulin or take certain oral diabetes medicines. Hypoglycemia can be a life-threatening condition. Symptoms of hypoglycemia (sleepiness, dizziness, and confusion) are similar to symptoms of having too much alcohol. If your health care provider says that alcohol is safe for you, follow these guidelines:  Limit alcohol intake to no more than 1 drink per day for nonpregnant women and 2 drinks per day for men. One drink equals 12 oz of beer, 5 oz of wine, or 1 oz of hard liquor.  Do not drink on an empty stomach.  Keep yourself hydrated with water, diet soda, or unsweetened iced tea.  Keep in mind that regular soda, juice, and other mixers may contain a lot of sugar and must be counted as carbs. What are tips for following this plan?  Reading food labels  Start by checking the serving size on the "Nutrition  Facts" label of packaged foods and drinks. The amount of calories, carbs, fats, and other nutrients listed on the label is based on one serving of the item. Many items contain more than one serving per package.  Check the total grams (g) of carbs in one serving. You can calculate the number of servings of carbs in one serving by dividing the total carbs by 15. For example, if a food has 30 g of total carbs, it would be equal to 2 servings of carbs.  Check the  number of grams (g) of saturated and trans fats in one serving. Choose foods that have low or no amount of these fats.  Check the number of milligrams (mg) of salt (sodium) in one serving. Most people should limit total sodium intake to less than 2,300 mg per day.  Always check the nutrition information of foods labeled as "low-fat" or "nonfat". These foods may be higher in added sugar or refined carbs and should be avoided.  Talk to your dietitian to identify your daily goals for nutrients listed on the label. Shopping  Avoid buying canned, premade, or processed foods. These foods tend to be high in fat, sodium, and added sugar.  Shop around the outside edge of the grocery store. This includes fresh fruits and vegetables, bulk grains, fresh meats, and fresh dairy. Cooking  Use low-heat cooking methods, such as baking, instead of high-heat cooking methods like deep frying.  Cook using healthy oils, such as olive, canola, or sunflower oil.  Avoid cooking with butter, cream, or high-fat meats. Meal planning  Eat meals and snacks regularly, preferably at the same times every day. Avoid going long periods of time without eating.  Eat foods high in fiber, such as fresh fruits, vegetables, beans, and whole grains. Talk to your dietitian about how many servings of carbs you can eat at each meal.  Eat 4-6 ounces (oz) of lean protein each day, such as lean meat, chicken, fish, eggs, or tofu. One oz of lean protein is equal to: ? 1 oz of meat, chicken, or fish. ? 1 egg. ?  cup of tofu.  Eat some foods each day that contain healthy fats, such as avocado, nuts, seeds, and fish. Lifestyle  Check your blood glucose regularly.  Exercise regularly as told by your health care provider. This may include: ? 150 minutes of moderate-intensity or vigorous-intensity exercise each week. This could be brisk walking, biking, or water aerobics. ? Stretching and doing strength exercises, such as yoga or  weightlifting, at least 2 times a week.  Take medicines as told by your health care provider.  Do not use any products that contain nicotine or tobacco, such as cigarettes and e-cigarettes. If you need help quitting, ask your health care provider.  Work with a Social worker or diabetes educator to identify strategies to manage stress and any emotional and social challenges. Questions to ask a health care provider  Do I need to meet with a diabetes educator?  Do I need to meet with a dietitian?  What number can I call if I have questions?  When are the best times to check my blood glucose? Where to find more information:  American Diabetes Association: diabetes.org  Academy of Nutrition and Dietetics: www.eatright.[employer] of Diabetes and Digestive and Kidney Diseases (NIH): [employer] Summary  A healthy meal plan will help you control your blood glucose and maintain a healthy lifestyle.  Working with a diet and nutrition specialist (dietitian) can help you  make a meal plan that is best for you.  Keep in mind that carbohydrates (carbs) and alcohol have immediate effects on your blood glucose levels. It is important to count carbs and to use alcohol carefully. This information is not intended to replace advice given to you by your health care provider. Make sure you discuss any questions you have with your health care provider. Document Released: 11/02/2004 Document Revised: 01/18/2017 Document Reviewed: 03/12/2016 Elsevier Patient Education  2020 [employer].

## 2018-12-08 NOTE — Assessment & Plan Note (Signed)
A1C of 7.8 today which is improved from three months ago. Weight gain of 10 pounds since last visit, she does admit to being sedentary.   Continue Metformin BID. Managed on statin.  Urine microalbumin UTD. Pneumonia vaccination UTD. Foot exam UTD. Eye exam UTD per patient's friend, she will have the eye center fax over her report.   Follow up in 3 months.

## 2018-12-08 NOTE — Progress Notes (Signed)
Subjective:    Patient ID: Gwenevere Ghazi, female    DOB: 1955-08-04, 62 y.o.   MRN: [id number]  HPI  Ms. Ruppert is a 63 year old female who presents today for follow up of diabetes.  Current medications include: Metformin 1000 mg BID.  Last A1C: 8.4 in July 2020, 7.8 today Last Eye Exam: Completed in 2020 Last Foot Exam: Due in July 2020 Pneumonia Vaccination: Completed in 2018 ACE/ARB: None, urine microalbumin  Statin: Crestor  BP Readings from Last 3 Encounters:  12/08/18 (!) 148/94  09/04/18 130/84  08/28/17 134/84    Wt Readings from Last 3 Encounters:  12/08/18 199 lb 1 oz (90.3 kg)  09/04/18 188 lb 4 oz (85.4 kg)  08/28/17 191 lb 8 oz (86.9 kg)   She is eating nutrisystem meals three times daily. She is walking 30 minutes on occasion.    Review of Systems  Eyes: Negative for visual disturbance.  Respiratory: Negative for shortness of breath.   Cardiovascular: Negative for chest pain.  Neurological: Negative for dizziness and numbness.       Past Medical History:  Diagnosis Date  . 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 Cellulitis and abscess of face   . Hyperglycemia 11/08/2014  . MR (mental retardation)   . Other malaise and fatigue   . Other screening breast examination   . Routine general medical examination at a health care facility   . Screening for lipoid disorders   . Screening for malignant neoplasm of the cervix   . Special screening for malignant neoplasms, colon      Social History   Socioeconomic History  . Marital status: Single    Spouse name: Not on file  . Number of children: Not on file  . Years of education: Not on file  . Highest education level: Not on file  Occupational History  . Not on file  Social Needs  . Financial resource strain: Not on file  . Food insecurity    Worry: Not on file    Inability: Not on file  . Transportation needs    Medical: Not on file    Non-medical: Not on file  Tobacco Use  .  Smoking status: Never Smoker  . Smokeless tobacco: Never Used  Substance and Sexual Activity  . Alcohol use: No  . Drug use: No  . Sexual activity: Not Currently  Lifestyle  . Physical activity    Days per week: Not on file    Minutes per session: Not on file  . Stress: Not on file  Relationships  . Social Musician on phone: Not on file    Gets together: Not on file    Attends religious service: Not on file    Active member of club or organization: Not on file    Attends meetings of clubs or organizations: Not on file    Relationship status: Not on file  . Intimate partner violence    Fear of current or ex partner: Not on file    Emotionally abused: Not on file    Physically abused: Not on file    Forced sexual activity: Not on file  Other Topics Concern  . Not on file  Social History Narrative   Lives in group home    No past surgical history on file.  Family History  Problem Relation Age of Onset  . Diabetes Mother   . Breast cancer Sister 87    No Known  Allergies  Current Outpatient Medications on File Prior to Visit  Medication Sig Dispense Refill  . metFORMIN (GLUCOPHAGE) 1000 MG tablet TAKE 1 TABLET BY MOUTH TWICE A DAY WITH A MEAL FOR DIABETES 180 tablet 1  . rosuvastatin (CRESTOR) 5 MG tablet TAKE ONE TABLET BY MOUTH ONCE EVERY EVENING FOR CHOLESTEROL 90 tablet 1  . triamcinolone ointment (KENALOG) 0.5 % Apply 1 application topically 2 (two) times daily. 30 g 0   No current facility-administered medications on file prior to visit.     BP (!) 148/94   Pulse (!) 105   Temp (!) 97.1 F (36.2 C) (Temporal)   Ht 5\' 1"  (1.549 m)   Wt 199 lb 1 oz (90.3 kg)   SpO2 95%   BMI 37.61 kg/m    Objective:   Physical Exam  Constitutional: She appears well-nourished.  Neck: Neck supple.  Cardiovascular: Normal rate and regular rhythm.  Respiratory: Effort normal and breath sounds normal.  Skin: Skin is warm and dry.  Psychiatric: She has a normal  mood and affect.           Assessment & Plan:

## 2018-12-08 NOTE — Assessment & Plan Note (Signed)
Above goal today, even on recheck. Recommended she purchase a blood pressure cuff and start monitoring at home. She will do so and report readings that are consistently at or above 135/90.  Consider adding lisinopril if BP above goal.

## 2019-03-10 ENCOUNTER — Encounter: Payer: Self-pay | Admitting: Primary Care

## 2019-03-10 ENCOUNTER — Ambulatory Visit (INDEPENDENT_AMBULATORY_CARE_PROVIDER_SITE_OTHER): Payer: Medicare HMO | Admitting: Primary Care

## 2019-03-10 ENCOUNTER — Other Ambulatory Visit: Payer: Self-pay

## 2019-03-10 VITALS — BP 130/86 | HR 100 | Temp 96.1°F | Ht 61.0 in | Wt 190.0 lb

## 2019-03-10 DIAGNOSIS — E119 Type 2 diabetes mellitus without complications: Secondary | ICD-10-CM | POA: Diagnosis not present

## 2019-03-10 DIAGNOSIS — R030 Elevated blood-pressure reading, without diagnosis of hypertension: Secondary | ICD-10-CM | POA: Diagnosis not present

## 2019-03-10 LAB — POCT GLYCOSYLATED HEMOGLOBIN (HGB A1C): Hemoglobin A1C: 6.8 % — AB (ref 4.0–5.6)

## 2019-03-10 NOTE — Assessment & Plan Note (Signed)
A1C improved today at 6.8, commended her on regular exercise and weight loss. Encouraged a healthy diet.  Managed on statin. Urine microalbumin due in July 2021. Foot and eye exams UTD. Pneumonia vaccination UTD.  Follow up in 6 months, she prefers to return in 3 months.

## 2019-03-10 NOTE — Assessment & Plan Note (Signed)
Improved, commended her on weight loss through exercise. Encouraged to continue.

## 2019-03-10 NOTE — Progress Notes (Signed)
Subjective:    Patient ID: Catherine Orr, female    DOB: [birth year], 64 y.o.   MRN: [id number]  HPI  This visit occurred during the SARS-CoV-2 public health emergency.  Safety protocols were in place, including screening questions prior to the visit, additional usage of staff PPE, and extensive cleaning of exam room while observing appropriate contact time as indicated for disinfecting solutions.   Catherine Orr is a 64 year old female with a history of hyperlipidemia, type 2 diabetes, elevated blood pressure readings who presents today for follow up of diabetes.  Current medications include: Metformin 1000 mg BID.   She is checking her blood glucose 0 times daily.  Last A1C: 7.8 in October 2020, 6.8 today Last Eye Exam: Completed in 2020 Last Foot Exam: Due in July 2021 Pneumonia Vaccination: Completed in 2018 ACE/ARB: None. Urine microalbumin  Statin: Crestor  BP Readings from Last 3 Encounters:  03/10/19 130/86  12/08/18 (!) 148/94  09/04/18 130/84   Wt Readings from Last 3 Encounters:  03/10/19 190 lb (86.2 kg)  12/08/18 199 lb 1 oz (90.3 kg)  09/04/18 188 lb 4 oz (85.4 kg)     Review of Systems  Eyes: Negative for visual disturbance.  Respiratory: Negative for shortness of breath.   Cardiovascular: Negative for chest pain.  Neurological: Negative for dizziness and headaches.       Past Medical History:  Diagnosis Date  . 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 Cellulitis and abscess of face   . Hyperglycemia 11/08/2014  . MR (mental retardation)   . Other malaise and fatigue   . Other screening breast examination   . Routine general medical examination at a health care facility   . Screening for lipoid disorders   . Screening for malignant neoplasm of the cervix   . Special screening for malignant neoplasms, colon      Social History   Socioeconomic History  . Marital status: Single    Spouse name: Not on file  . Number of children: Not on file   . Years of education: Not on file  . Highest education level: Not on file  Occupational History  . Not on file  Tobacco Use  . Smoking status: Never Smoker  . Smokeless tobacco: Never Used  Substance and Sexual Activity  . Alcohol use: No  . Drug use: No  . Sexual activity: Not Currently  Other Topics Concern  . Not on file  Social History Narrative   Lives in group home   Social Determinants of Health   Financial Resource Strain:   . Difficulty of Paying Living Expenses: Not on file  Food Insecurity:   . Worried About Charity fundraiser in the Last Year: Not on file  . Ran Out of Food in the Last Year: Not on file  Transportation Needs:   . Lack of Transportation (Medical): Not on file  . Lack of Transportation (Non-Medical): Not on file  Physical Activity:   . Days of Exercise per Week: Not on file  . Minutes of Exercise per Session: Not on file  Stress:   . Feeling of Stress : Not on file  Social Connections:   . Frequency of Communication with Friends and Family: Not on file  . Frequency of Social Gatherings with Friends and Family: Not on file  . Attends Religious Services: Not on file  . Active Member of Clubs or Organizations: Not on file  . Attends Archivist Meetings: Not  on file  . Marital Status: Not on file  Intimate Partner Violence:   . Fear of Current or Ex-Partner: Not on file  . Emotionally Abused: Not on file  . Physically Abused: Not on file  . Sexually Abused: Not on file    No past surgical history on file.  Family History  Problem Relation Age of Onset  . Diabetes Mother   . Breast cancer Sister 61    No Known Allergies  Current Outpatient Medications on File Prior to Visit  Medication Sig Dispense Refill  . blood glucose meter kit and supplies KIT Dispense based on patient and insurance preference. Use up to four times daily as directed. (FOR ICD-9 250.00, 250.01). 1 each 0  . metFORMIN (GLUCOPHAGE) 1000 MG tablet TAKE 1  TABLET BY MOUTH TWICE A DAY WITH A MEAL FOR DIABETES 180 tablet 1  . rosuvastatin (CRESTOR) 5 MG tablet TAKE ONE TABLET BY MOUTH ONCE EVERY EVENING FOR CHOLESTEROL 90 tablet 1  . triamcinolone ointment (KENALOG) 0.5 % Apply 1 application topically 2 (two) times daily. 30 g 0   No current facility-administered medications on file prior to visit.    BP 130/86   Pulse 100   Temp (!) 96.1 F (35.6 C) (Temporal)   Ht 5' 1"  (1.549 m)   Wt 190 lb (86.2 kg)   SpO2 98%   BMI 35.90 kg/m    Objective:   Physical Exam  Constitutional: She appears well-nourished.  Cardiovascular: Normal rate and regular rhythm.  Respiratory: Effort normal and breath sounds normal.  Musculoskeletal:     Cervical back: Neck supple.  Skin: Skin is warm and dry.  Psychiatric: She has a normal mood and affect.           Assessment & Plan:

## 2019-03-10 NOTE — Patient Instructions (Signed)
Continue exercising. You should be getting 150 minutes of moderate intensity exercise weekly.  It is important that you improve your diet. Please limit carbohydrates in the form of white bread, rice, pasta, sweets, fast food, fried food, sugary drinks, etc. Increase your consumption of fresh fruits and vegetables, whole grains, lean protein.  Ensure you are consuming 64 ounces of water daily.  Please schedule a follow up appointment in 3 months for diabetes check.  It was a pleasure to see you today! Great job!!!   Diabetes Mellitus and Nutrition, Adult When you have diabetes (diabetes mellitus), it is very important to have healthy eating habits because your blood sugar (glucose) levels are greatly affected by what you eat and drink. Eating healthy foods in the appropriate amounts, at about the same times every day, can help you:  Control your blood glucose.  Lower your risk of heart disease.  Improve your blood pressure.  Reach or maintain a healthy weight. Every person with diabetes is different, and each person has different needs for a meal plan. Your health care provider may recommend that you work with a diet and nutrition specialist (dietitian) to make a meal plan that is best for you. Your meal plan may vary depending on factors such as:  The calories you need.  The medicines you take.  Your weight.  Your blood glucose, blood pressure, and cholesterol levels.  Your activity level.  Other health conditions you have, such as heart or kidney disease. How do carbohydrates affect me? Carbohydrates, also called carbs, affect your blood glucose level more than any other type of food. Eating carbs naturally raises the amount of glucose in your blood. Carb counting is a method for keeping track of how many carbs you eat. Counting carbs is important to keep your blood glucose at a healthy level, especially if you use insulin or take certain oral diabetes medicines. It is important  to know how many carbs you can safely have in each meal. This is different for every person. Your dietitian can help you calculate how many carbs you should have at each meal and for each snack. Foods that contain carbs include:  Bread, cereal, rice, pasta, and crackers.  Potatoes and corn.  Peas, beans, and lentils.  Milk and yogurt.  Fruit and juice.  Desserts, such as cakes, cookies, ice cream, and candy. How does alcohol affect me? Alcohol can cause a sudden decrease in blood glucose (hypoglycemia), especially if you use insulin or take certain oral diabetes medicines. Hypoglycemia can be a life-threatening condition. Symptoms of hypoglycemia (sleepiness, dizziness, and confusion) are similar to symptoms of having too much alcohol. If your health care provider says that alcohol is safe for you, follow these guidelines:  Limit alcohol intake to no more than 1 drink per day for nonpregnant women and 2 drinks per day for men. One drink equals 12 oz of beer, 5 oz of wine, or 1 oz of hard liquor.  Do not drink on an empty stomach.  Keep yourself hydrated with water, diet soda, or unsweetened iced tea.  Keep in mind that regular soda, juice, and other mixers may contain a lot of sugar and must be counted as carbs. What are tips for following this plan?  Reading food labels  Start by checking the serving size on the "Nutrition Facts" label of packaged foods and drinks. The amount of calories, carbs, fats, and other nutrients listed on the label is based on one serving of the item. Many  items contain more than one serving per package.  Check the total grams (g) of carbs in one serving. You can calculate the number of servings of carbs in one serving by dividing the total carbs by 15. For example, if a food has 30 g of total carbs, it would be equal to 2 servings of carbs.  Check the number of grams (g) of saturated and trans fats in one serving. Choose foods that have low or no amount  of these fats.  Check the number of milligrams (mg) of salt (sodium) in one serving. Most people should limit total sodium intake to less than 2,300 mg per day.  Always check the nutrition information of foods labeled as "low-fat" or "nonfat". These foods may be higher in added sugar or refined carbs and should be avoided.  Talk to your dietitian to identify your daily goals for nutrients listed on the label. Shopping  Avoid buying canned, premade, or processed foods. These foods tend to be high in fat, sodium, and added sugar.  Shop around the outside edge of the grocery store. This includes fresh fruits and vegetables, bulk grains, fresh meats, and fresh dairy. Cooking  Use low-heat cooking methods, such as baking, instead of high-heat cooking methods like deep frying.  Cook using healthy oils, such as olive, canola, or sunflower oil.  Avoid cooking with butter, cream, or high-fat meats. Meal planning  Eat meals and snacks regularly, preferably at the same times every day. Avoid going long periods of time without eating.  Eat foods high in fiber, such as fresh fruits, vegetables, beans, and whole grains. Talk to your dietitian about how many servings of carbs you can eat at each meal.  Eat 4-6 ounces (oz) of lean protein each day, such as lean meat, chicken, fish, eggs, or tofu. One oz of lean protein is equal to: ? 1 oz of meat, chicken, or fish. ? 1 egg. ?  cup of tofu.  Eat some foods each day that contain healthy fats, such as avocado, nuts, seeds, and fish. Lifestyle  Check your blood glucose regularly.  Exercise regularly as told by your health care provider. This may include: ? 150 minutes of moderate-intensity or vigorous-intensity exercise each week. This could be brisk walking, biking, or water aerobics. ? Stretching and doing strength exercises, such as yoga or weightlifting, at least 2 times a week.  Take medicines as told by your health care provider.  Do not  use any products that contain nicotine or tobacco, such as cigarettes and e-cigarettes. If you need help quitting, ask your health care provider.  Work with a Social worker or diabetes educator to identify strategies to manage stress and any emotional and social challenges. Questions to ask a health care provider  Do I need to meet with a diabetes educator?  Do I need to meet with a dietitian?  What number can I call if I have questions?  When are the best times to check my blood glucose? Where to find more information:  American Diabetes Association: diabetes.org  Academy of Nutrition and Dietetics: www.eatright.[employer] of Diabetes and Digestive and Kidney Diseases (NIH): [employer] Summary  A healthy meal plan will help you control your blood glucose and maintain a healthy lifestyle.  Working with a diet and nutrition specialist (dietitian) can help you make a meal plan that is best for you.  Keep in mind that carbohydrates (carbs) and alcohol have immediate effects on your blood glucose levels. It is important  to count carbs and to use alcohol carefully. This information is not intended to replace advice given to you by your health care provider. Make sure you discuss any questions you have with your health care provider. Document Revised: 01/18/2017 Document Reviewed: 03/12/2016 Elsevier Patient Education  2020 [employer].

## 2019-06-09 ENCOUNTER — Ambulatory Visit: Payer: Medicare HMO | Admitting: Primary Care

## 2019-06-09 ENCOUNTER — Other Ambulatory Visit: Payer: Self-pay

## 2019-06-09 VITALS — BP 126/82 | HR 94 | Temp 96.5°F | Ht 61.0 in | Wt 181.0 lb

## 2019-06-09 DIAGNOSIS — E785 Hyperlipidemia, unspecified: Secondary | ICD-10-CM

## 2019-06-09 DIAGNOSIS — R030 Elevated blood-pressure reading, without diagnosis of hypertension: Secondary | ICD-10-CM

## 2019-06-09 DIAGNOSIS — E119 Type 2 diabetes mellitus without complications: Secondary | ICD-10-CM | POA: Diagnosis not present

## 2019-06-09 LAB — POCT GLYCOSYLATED HEMOGLOBIN (HGB A1C): Hemoglobin A1C: 6.4 % — AB (ref 4.0–5.6)

## 2019-06-09 MED ORDER — ROSUVASTATIN CALCIUM 5 MG PO TABS: ORAL_TABLET | ORAL | 3 refills | Status: DC

## 2019-06-09 MED ORDER — METFORMIN HCL 1000 MG PO TABS: ORAL_TABLET | ORAL | 3 refills | Status: DC

## 2019-06-09 NOTE — Assessment & Plan Note (Signed)
Well controlled today, commended her on weight loss! Continue to monitor.

## 2019-06-09 NOTE — Assessment & Plan Note (Signed)
Refill provided for Crestor, repeat labs due next week.

## 2019-06-09 NOTE — Assessment & Plan Note (Signed)
Significant improvement in A1C down to 6.4, commended her on this success.  Continue Metformin 1000 mg BID. Lipid panel and urine microalbumin due next visit.  Foot exam today.  Pneumonia vaccination UTD. Eye exam due in Summer 2021.  Follow up in 6 months.

## 2019-06-09 NOTE — Patient Instructions (Signed)
Start exercising. You should be getting 150 minutes of exercise weekly.  Continue to work on a healthy diet. Ensure you are consuming 64 ounces of water daily.  Please schedule a follow up appointment in 6 months for your physical.   Congratulations on your A1C number!!!! You go girl!!!

## 2019-06-09 NOTE — Progress Notes (Signed)
Subjective:    Patient ID: Catherine Orr, female    DOB: 11-24-1955, 64 y.o.   MRN: [id number]  HPI  This visit occurred during the SARS-CoV-2 public health emergency.  Safety protocols were in place, including screening questions prior to the visit, additional usage of staff PPE, and extensive cleaning of exam room while observing appropriate contact time as indicated for disinfecting solutions.   Catherine Orr is a 64 year old female with a history of type 2 diabetes, hyperlipidemia, elevated blood pressure who presents today for follow up of diabetes.  Current medications include: Metformin 1000 mg BID.   Last A1C: 6.8 in January 2021, 6.4 today Last Eye Exam: UTD Last Foot Exam: UTD Pneumonia Vaccination: Completed in 2018 ACE/ARB: None, urine microalbumin negative in July 2020 Statin: Crestor  BP Readings from Last 3 Encounters:  06/09/19 126/82  03/10/19 130/86  12/08/18 (!) 148/94    Wt Readings from Last 3 Encounters:  06/09/19 181 lb (82.1 kg)  03/10/19 190 lb (86.2 kg)  12/08/18 199 lb 1 oz (90.3 kg)    Since her last visit she is walking daily, she has cut back on sugary drinks and sweets. She is weighing herself at home    Review of Systems  Eyes: Negative for visual disturbance.  Respiratory: Negative for shortness of breath.   Cardiovascular: Negative for chest pain.  Neurological: Negative for dizziness and headaches.       Past Medical History:  Diagnosis Date  . 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 Cellulitis and abscess of face   . Hyperglycemia 11/08/2014  . MR (mental retardation)   . Other malaise and fatigue   . Other screening breast examination   . Routine general medical examination at a health care facility   . Screening for lipoid disorders   . Screening for malignant neoplasm of the cervix   . Special screening for malignant neoplasms, colon      Social History   Socioeconomic History  . Marital status: Single    Spouse  name: Not on file  . Number of children: Not on file  . Years of education: Not on file  . Highest education level: Not on file  Occupational History  . Not on file  Tobacco Use  . Smoking status: Never Smoker  . Smokeless tobacco: Never Used  Substance and Sexual Activity  . Alcohol use: No  . Drug use: No  . Sexual activity: Not Currently  Other Topics Concern  . Not on file  Social History Narrative   Lives in group home   Social Determinants of Health   Financial Resource Strain:   . Difficulty of Paying Living Expenses:   Food Insecurity:   . Worried About Charity fundraiser in the Last Year:   . Arboriculturist in the Last Year:   Transportation Needs:   . Film/video editor (Medical):   Marland Kitchen Lack of Transportation (Non-Medical):   Physical Activity:   . Days of Exercise per Week:   . Minutes of Exercise per Session:   Stress:   . Feeling of Stress :   Social Connections:   . Frequency of Communication with Friends and Family:   . Frequency of Social Gatherings with Friends and Family:   . Attends Religious Services:   . Active Member of Clubs or Organizations:   . Attends Archivist Meetings:   Marland Kitchen Marital Status:   Intimate Partner Violence:   . Fear  of Current or Ex-Partner:   . Emotionally Abused:   Marland Kitchen Physically Abused:   . Sexually Abused:     No past surgical history on file.  Family History  Problem Relation Age of Onset  . Diabetes Mother   . Breast cancer Sister 49    No Known Allergies  Current Outpatient Medications on File Prior to Visit  Medication Sig Dispense Refill  . blood glucose meter kit and supplies KIT Dispense based on patient and insurance preference. Use up to four times daily as directed. (FOR ICD-9 250.00, 250.01). 1 each 0  . triamcinolone ointment (KENALOG) 0.5 % Apply 1 application topically 2 (two) times daily. 30 g 0   No current facility-administered medications on file prior to visit.    BP 126/82    Pulse 94   Temp (!) 96.5 F (35.8 C) (Temporal)   Ht '5\' 1"'  (1.549 m)   Wt 181 lb (82.1 kg)   SpO2 98%   BMI 34.20 kg/m   s Objective:   Physical Exam  Constitutional: She appears well-nourished.  Cardiovascular: Normal rate and regular rhythm.  Respiratory: Effort normal and breath sounds normal.  Musculoskeletal:     Cervical back: Neck supple.  Skin: Skin is warm and dry.           Assessment & Plan:

## 2019-10-13 DIAGNOSIS — H2513 Age-related nuclear cataract, bilateral: Secondary | ICD-10-CM | POA: Diagnosis not present

## 2019-10-13 DIAGNOSIS — E119 Type 2 diabetes mellitus without complications: Secondary | ICD-10-CM | POA: Diagnosis not present

## 2019-10-13 DIAGNOSIS — H11153 Pinguecula, bilateral: Secondary | ICD-10-CM | POA: Diagnosis not present

## 2019-10-13 LAB — HM DIABETES EYE EXAM

## 2019-10-22 ENCOUNTER — Encounter: Payer: Self-pay | Admitting: Primary Care

## 2019-11-23 ENCOUNTER — Other Ambulatory Visit: Payer: Self-pay | Admitting: Primary Care

## 2019-11-23 DIAGNOSIS — E785 Hyperlipidemia, unspecified: Secondary | ICD-10-CM

## 2019-11-23 DIAGNOSIS — E119 Type 2 diabetes mellitus without complications: Secondary | ICD-10-CM

## 2019-11-24 ENCOUNTER — Telehealth: Payer: Self-pay | Admitting: Primary Care

## 2019-11-24 NOTE — Telephone Encounter (Signed)
LVM for pt to rtn my call to R/S appt with [facility] on 12/02/19

## 2019-12-02 ENCOUNTER — Ambulatory Visit: Payer: Medicare HMO

## 2019-12-02 ENCOUNTER — Other Ambulatory Visit: Payer: Medicare HMO

## 2019-12-08 ENCOUNTER — Other Ambulatory Visit (INDEPENDENT_AMBULATORY_CARE_PROVIDER_SITE_OTHER): Payer: Medicare HMO

## 2019-12-08 ENCOUNTER — Other Ambulatory Visit: Payer: Self-pay

## 2019-12-08 DIAGNOSIS — E785 Hyperlipidemia, unspecified: Secondary | ICD-10-CM | POA: Diagnosis not present

## 2019-12-08 DIAGNOSIS — E119 Type 2 diabetes mellitus without complications: Secondary | ICD-10-CM | POA: Diagnosis not present

## 2019-12-08 LAB — COMPREHENSIVE METABOLIC PANEL
ALT: 19 U/L (ref 0–35)
AST: 12 U/L (ref 0–37)
Albumin: 4.5 g/dL (ref 3.5–5.2)
Alkaline Phosphatase: 70 U/L (ref 39–117)
BUN: 11 mg/dL (ref 6–23)
CO2: 31 mEq/L (ref 19–32)
Calcium: 10.2 mg/dL (ref 8.4–10.5)
Chloride: 100 mEq/L (ref 96–112)
Creatinine, Ser: 0.66 mg/dL (ref 0.40–1.20)
GFR: 92.09 mL/min (ref >60.00)
Glucose, Bld: 138 mg/dL — ABNORMAL HIGH (ref 70–99)
Potassium: 3.7 mEq/L (ref 3.5–5.1)
Sodium: 139 mEq/L (ref 135–145)
Total Bilirubin: 0.4 mg/dL (ref 0.2–1.2)
Total Protein: 7.3 g/dL (ref 6.0–8.3)

## 2019-12-08 LAB — LIPID PANEL
Cholesterol: 284 mg/dL — ABNORMAL HIGH (ref 0–200)
HDL: 42.2 mg/dL (ref >39.00)
NonHDL: 241.97
Total CHOL/HDL Ratio: 7
Triglycerides: 341 mg/dL — ABNORMAL HIGH (ref 0.0–149.0)
VLDL: 68.2 mg/dL — ABNORMAL HIGH (ref 0.0–40.0)

## 2019-12-08 LAB — CBC
HCT: 41.6 % (ref 36.0–46.0)
Hemoglobin: 14.4 g/dL (ref 12.0–15.0)
MCHC: 34.7 g/dL (ref 30.0–36.0)
MCV: 91.3 fl (ref 78.0–100.0)
Platelets: 396 10*3/uL (ref 150.0–400.0)
RBC: 4.55 Mil/uL (ref 3.87–5.11)
RDW: 12.7 % (ref 11.5–15.5)
WBC: 6.6 10*3/uL (ref 4.0–10.5)

## 2019-12-08 LAB — HEMOGLOBIN A1C: Hgb A1c MFr Bld: 6.5 % (ref 4.6–6.5)

## 2019-12-08 LAB — MICROALBUMIN / CREATININE URINE RATIO
Creatinine,U: 61.6 mg/dL
Microalb Creat Ratio: 11.1 mg/g (ref 0.0–30.0)
Microalb, Ur: 6.8 mg/dL — ABNORMAL HIGH (ref 0.0–1.9)

## 2019-12-08 LAB — LDL CHOLESTEROL, DIRECT: Direct LDL: 185 mg/dL

## 2019-12-09 ENCOUNTER — Encounter: Payer: Medicare HMO | Admitting: Primary Care

## 2019-12-15 ENCOUNTER — Ambulatory Visit (INDEPENDENT_AMBULATORY_CARE_PROVIDER_SITE_OTHER): Payer: Medicare HMO | Admitting: Primary Care

## 2019-12-15 ENCOUNTER — Encounter: Payer: Self-pay | Admitting: Primary Care

## 2019-12-15 ENCOUNTER — Other Ambulatory Visit: Payer: Self-pay

## 2019-12-15 VITALS — BP 124/80 | HR 93 | Temp 97.5°F | Ht 61.0 in | Wt 172.0 lb

## 2019-12-15 DIAGNOSIS — E119 Type 2 diabetes mellitus without complications: Secondary | ICD-10-CM | POA: Diagnosis not present

## 2019-12-15 DIAGNOSIS — E785 Hyperlipidemia, unspecified: Secondary | ICD-10-CM | POA: Diagnosis not present

## 2019-12-15 DIAGNOSIS — Z1211 Encounter for screening for malignant neoplasm of colon: Secondary | ICD-10-CM

## 2019-12-15 DIAGNOSIS — Z1231 Encounter for screening mammogram for malignant neoplasm of breast: Secondary | ICD-10-CM

## 2019-12-15 DIAGNOSIS — Z0000 Encounter for general adult medical examination without abnormal findings: Secondary | ICD-10-CM

## 2019-12-15 DIAGNOSIS — E2839 Other primary ovarian failure: Secondary | ICD-10-CM

## 2019-12-15 NOTE — Patient Instructions (Signed)
Resume your rosuvastatin (Crestor) 5 mg tablets for cholesterol.  Continue to work on a healthy diet. Ensure you are consuming 64 ounces of water daily.  Continue exercising. You should be getting 150 minutes of exercise weekly.  Schedule a lab appointment for 2 months to repeat cholesterol.  Please schedule a follow up appointment in 6 months for diabetes check.   It was a pleasure to see you today!   Preventive Care 65 Years and Older, Female Preventive care refers to lifestyle choices and visits with your health care provider that can promote health and wellness. This includes:  A yearly physical exam. This is also called an annual well check.  Regular dental and eye exams.  Immunizations.  Screening for certain conditions.  Healthy lifestyle choices, such as diet and exercise. What can I expect for my preventive care visit? Physical exam Your health care provider will check:  Height and weight. These may be used to calculate body mass index (BMI), which is a measurement that tells if you are at a healthy weight.  Heart rate and blood pressure.  Your skin for abnormal spots. Counseling Your health care provider may ask you questions about:  Alcohol, tobacco, and drug use.  Emotional well-being.  Home and relationship well-being.  Sexual activity.  Eating habits.  History of falls.  Memory and ability to understand (cognition).  Work and work Statistician.  Pregnancy and menstrual history. What immunizations do I need?  Influenza (flu) vaccine  This is recommended every year. Tetanus, diphtheria, and pertussis (Tdap) vaccine  You may need a Td booster every 10 years. Varicella (chickenpox) vaccine  You may need this vaccine if you have not already been vaccinated. Zoster (shingles) vaccine  You may need this after age 70. Pneumococcal conjugate (PCV13) vaccine  One dose is recommended after age 84. Pneumococcal polysaccharide (PPSV23)  vaccine  One dose is recommended after age 53. Measles, mumps, and rubella (MMR) vaccine  You may need at least one dose of MMR if you were born in 1957 or later. You may also need a second dose. Meningococcal conjugate (MenACWY) vaccine  You may need this if you have certain conditions. Hepatitis A vaccine  You may need this if you have certain conditions or if you travel or work in places where you may be exposed to hepatitis A. Hepatitis B vaccine  You may need this if you have certain conditions or if you travel or work in places where you may be exposed to hepatitis B. Haemophilus influenzae type b (Hib) vaccine  You may need this if you have certain conditions. You may receive vaccines as individual doses or as more than one vaccine together in one shot (combination vaccines). Talk with your health care provider about the risks and benefits of combination vaccines. What tests do I need? Blood tests  Lipid and cholesterol levels. These may be checked every 5 years, or more frequently depending on your overall health.  Hepatitis C test.  Hepatitis B test. Screening  Lung cancer screening. You may have this screening every year starting at age 41 if you have a 30-pack-year history of smoking and currently smoke or have quit within the past 15 years.  Colorectal cancer screening. All adults should have this screening starting at age 19 and continuing until age 72. Your health care provider may recommend screening at age 24 if you are at increased risk. You will have tests every 1-10 years, depending on your results and the type of screening  test.  Diabetes screening. This is done by checking your blood sugar (glucose) after you have not eaten for a while (fasting). You may have this done every 1-3 years.  Mammogram. This may be done every 1-2 years. Talk with your health care provider about how often you should have regular mammograms.  BRCA-related cancer screening. This may  be done if you have a family history of breast, ovarian, tubal, or peritoneal cancers. Other tests  Sexually transmitted disease (STD) testing.  Bone density scan. This is done to screen for osteoporosis. You may have this done starting at age 53. Follow these instructions at home: Eating and drinking  Eat a diet that includes fresh fruits and vegetables, whole grains, lean protein, and low-fat dairy products. Limit your intake of foods with high amounts of sugar, saturated fats, and salt.  Take vitamin and mineral supplements as recommended by your health care provider.  Do not drink alcohol if your health care provider tells you not to drink.  If you drink alcohol: ? Limit how much you have to 0-1 drink a day. ? Be aware of how much alcohol is in your drink. In the [place] one drink equals one 12 oz bottle of beer (355 mL), one 5 oz glass of wine (148 mL), or one 1 oz glass of hard liquor (44 mL). Lifestyle  Take daily care of your teeth and gums.  Stay active. Exercise for at least 30 minutes on 5 or more days each week.  Do not use any products that contain nicotine or tobacco, such as cigarettes, e-cigarettes, and chewing tobacco. If you need help quitting, ask your health care provider.  If you are sexually active, practice safe sex. Use a condom or other form of protection in order to prevent STIs (sexually transmitted infections).  Talk with your health care provider about taking a low-dose aspirin or statin. What's next?  Go to your health care provider once a year for a well check visit.  Ask your health care provider how often you should have your eyes and teeth checked.  Stay up to date on all vaccines. This information is not intended to replace advice given to you by your health care provider. Make sure you discuss any questions you have with your health care provider. Document Revised: 01/30/2018 Document Reviewed: 01/30/2018 Elsevier Patient Education  2020  [employer].

## 2019-12-15 NOTE — Assessment & Plan Note (Signed)
She stopped rosuvastatin months ago as she didn't think she needed it. Recent LDL of 185, discussed to resume, she will resume.  Repeat lipids in 2 months.

## 2019-12-15 NOTE — Progress Notes (Signed)
Subjective:    Patient ID: Catherine Orr, female    DOB: Jun 24, 1955, 64 y.o.   MRN: [id number]  HPI  This visit occurred during the SARS-CoV-2 public health emergency.  Safety protocols were in place, including screening questions prior to the visit, additional usage of staff PPE, and extensive cleaning of exam room while observing appropriate contact time as indicated for disinfecting solutions.   [name] is a 64 year old female who presents today for complete physical.  Immunizations: -Tetanus: Completed in 2011 -Influenza: Declines -Shingles: Completed series  -Pneumonia: Completed Prevnar in 2018 -Covid-19: Has not completed  Diet: She endorse an improved. She's cut out soda and snacking.  Exercise: She is walking and active at home.  Eye exam: UTD Dental exam: Completes annually   Mammogram: Completed in August 2020 Dexa: Due Colonoscopy: Completed in 2011 Hep C Screen: Negative   BP Readings from Last 3 Encounters:  12/15/19 124/80  06/09/19 126/82  03/10/19 130/86   Wt Readings from Last 3 Encounters:  12/15/19 172 lb (78 kg)  06/09/19 181 lb (82.1 kg)  03/10/19 190 lb (86.2 kg)     Review of Systems  Constitutional: Negative for unexpected weight change.  HENT: Negative for rhinorrhea.   Eyes: Negative for visual disturbance.  Respiratory: Negative for cough and shortness of breath.   Cardiovascular: Negative for chest pain.  Gastrointestinal: Negative for constipation and diarrhea.  Genitourinary: Negative for difficulty urinating.  Musculoskeletal: Positive for arthralgias.  Skin: Negative for rash.  Allergic/Immunologic: Negative for environmental allergies.  Neurological: Negative for dizziness, numbness and headaches.  Psychiatric/Behavioral: The patient is not nervous/anxious.        Past Medical History:  Diagnosis Date  . 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 Cellulitis and abscess of face   . Hyperglycemia 11/08/2014  . MR  (mental retardation)   . Other malaise and fatigue   . Other screening breast examination   . Routine general medical examination at a health care facility   . Screening for lipoid disorders   . Screening for malignant neoplasm of the cervix   . Special screening for malignant neoplasms, colon      Social History   Socioeconomic History  . Marital status: Single    Spouse name: Not on file  . Number of children: Not on file  . Years of education: Not on file  . Highest education level: Not on file  Occupational History  . Not on file  Tobacco Use  . Smoking status: Never Smoker  . Smokeless tobacco: Never Used  Vaping Use  . Vaping Use: Never used  Substance and Sexual Activity  . Alcohol use: No  . Drug use: No  . Sexual activity: Not Currently  Other Topics Concern  . Not on file  Social History Narrative   Lives in group home   Social Determinants of Health   Financial Resource Strain:   . Difficulty of Paying Living Expenses: Not on file  Food Insecurity:   . Worried About Programme researcher, broadcasting/film/video in the Last Year: Not on file  . Ran Out of Food in the Last Year: Not on file  Transportation Needs:   . Lack of Transportation (Medical): Not on file  . Lack of Transportation (Non-Medical): Not on file  Physical Activity:   . Days of Exercise per Week: Not on file  . Minutes of Exercise per Session: Not on file  Stress:   . Feeling of Stress : Not  on file  Social Connections:   . Frequency of Communication with Friends and Family: Not on file  . Frequency of Social Gatherings with Friends and Family: Not on file  . Attends Religious Services: Not on file  . Active Member of Clubs or Organizations: Not on file  . Attends Banker Meetings: Not on file  . Marital Status: Not on file  Intimate Partner Violence:   . Fear of Current or Ex-Partner: Not on file  . Emotionally Abused: Not on file  . Physically Abused: Not on file  . Sexually Abused: Not on  file    History reviewed. No pertinent surgical history.  Family History  Problem Relation Age of Onset  . Diabetes Mother   . Breast cancer Sister 53    No Known Allergies  Current Outpatient Medications on File Prior to Visit  Medication Sig Dispense Refill  . metFORMIN (GLUCOPHAGE) 1000 MG tablet TAKE 1 TABLET BY MOUTH TWICE A DAY WITH A MEAL FOR DIABETES 180 tablet 3  . rosuvastatin (CRESTOR) 5 MG tablet TAKE ONE TABLET BY MOUTH ONCE EVERY EVENING FOR CHOLESTEROL 90 tablet 3   No current facility-administered medications on file prior to visit.    BP 124/80   Pulse 93   Temp (!) 97.5 F (36.4 C) (Temporal)   Ht 5\' 1"  (1.549 m)   Wt 172 lb (78 kg)   SpO2 97%   BMI 32.50 kg/m    Objective:   Physical Exam HENT:     Right Ear: Tympanic membrane and ear canal normal.     Left Ear: Tympanic membrane and ear canal normal.  Eyes:     Pupils: Pupils are equal, round, and reactive to light.  Cardiovascular:     Rate and Rhythm: Normal rate and regular rhythm.  Pulmonary:     Effort: Pulmonary effort is normal.     Breath sounds: Normal breath sounds.  Abdominal:     General: Bowel sounds are normal.     Palpations: Abdomen is soft.     Tenderness: There is no abdominal tenderness.  Musculoskeletal:        General: Normal range of motion.     Cervical back: Neck supple.  Skin:    General: Skin is warm and dry.  Neurological:     Mental Status: She is alert and oriented to person, place, and time.     Cranial Nerves: No cranial nerve deficit.     Deep Tendon Reflexes:     Reflex Scores:      Patellar reflexes are 2+ on the right side and 2+ on the left side. Psychiatric:        Mood and Affect: Mood normal.            Assessment & Plan:

## 2019-12-15 NOTE — Assessment & Plan Note (Signed)
Pneumovax due and provided. Declines influenza vaccination. She has had the Shingrix vaccine series, she will update [place] with her second dose.  Mammogram and bone density scans due, orders placed. Colonoscopy due, she declines the procedure, opts for Cologuard.   Encouraged a healthy diet and regular exercise. Exam today benign. Labs reviewed.

## 2019-12-15 NOTE — Assessment & Plan Note (Addendum)
Compliant to Metformin 1000 mg BID. Recent A1C of 6.5.   Continue Metformin 1000 mg BID. She stopped her rosuvastatin months ago as she thought she didn't need it. LDL today of 185, she will resume.  Foot and eye exam UTD. Urine microalbumin negative.  Follow up in 6 months.

## 2019-12-16 ENCOUNTER — Ambulatory Visit (INDEPENDENT_AMBULATORY_CARE_PROVIDER_SITE_OTHER): Payer: Medicare HMO

## 2019-12-16 DIAGNOSIS — Z0000 Encounter for general adult medical examination without abnormal findings: Secondary | ICD-10-CM

## 2019-12-16 NOTE — Patient Instructions (Signed)
[name] , Thank you for taking time to come for your Medicare Wellness Visit. I appreciate your ongoing commitment to your health goals. Please review the following plan we discussed and let me know if I can assist you in the future.   Screening recommendations/referrals: Colonoscopy: Cologuard ordered 12/15/2019 Mammogram: ordered 12/15/2019, Call and schedule appointment Bone Density: ordered 12/15/2019, Call and schedule appointment Recommended yearly ophthalmology/optometry visit for glaucoma screening and checkup Recommended yearly dental visit for hygiene and checkup  Vaccinations: Influenza vaccine: decline Pneumococcal vaccine: declined Tdap vaccine: declined Shingles vaccine: declined   Covid-19:declined  Advanced directives: Advance directive discussed with you today. Even though you declined this today please call our office should you change your mind and we can give you the proper paperwork for you to fill out.   Conditions/risks identified: diabetes  Next appointment: Follow up in one year for your annual wellness visit    Preventive Care 65 Years and Older, Female Preventive care refers to lifestyle choices and visits with your health care provider that can promote health and wellness. What does preventive care include?  A yearly physical exam. This is also called an annual well check.  Dental exams once or twice a year.  Routine eye exams. Ask your health care provider how often you should have your eyes checked.  Personal lifestyle choices, including:  Daily care of your teeth and gums.  Regular physical activity.  Eating a healthy diet.  Avoiding tobacco and drug use.  Limiting alcohol use.  Practicing safe sex.  Taking low-dose aspirin every day.  Taking vitamin and mineral supplements as recommended by your health care provider. What happens during an annual well check? The services and screenings done by your health care provider during  your annual well check will depend on your age, overall health, lifestyle risk factors, and family history of disease. Counseling  Your health care provider may ask you questions about your:  Alcohol use.  Tobacco use.  Drug use.  Emotional well-being.  Home and relationship well-being.  Sexual activity.  Eating habits.  History of falls.  Memory and ability to understand (cognition).  Work and work Astronomer.  Reproductive health. Screening  You may have the following tests or measurements:  Height, weight, and BMI.  Blood pressure.  Lipid and cholesterol levels. These may be checked every 5 years, or more frequently if you are over 60 years old.  Skin check.  Lung cancer screening. You may have this screening every year starting at age 60 if you have a 30-pack-year history of smoking and currently smoke or have quit within the past 15 years.  Fecal occult blood test (FOBT) of the stool. You may have this test every year starting at age 12.  Flexible sigmoidoscopy or colonoscopy. You may have a sigmoidoscopy every 5 years or a colonoscopy every 10 years starting at age 68.  Hepatitis C blood test.  Hepatitis B blood test.  Sexually transmitted disease (STD) testing.  Diabetes screening. This is done by checking your blood sugar (glucose) after you have not eaten for a while (fasting). You may have this done every 1-3 years.  Bone density scan. This is done to screen for osteoporosis. You may have this done starting at age 72.  Mammogram. This may be done every 1-2 years. Talk to your health care provider about how often you should have regular mammograms. Talk with your health care provider about your test results, treatment options, and if necessary, the need for  more tests. Vaccines  Your health care provider may recommend certain vaccines, such as:  Influenza vaccine. This is recommended every year.  Tetanus, diphtheria, and acellular pertussis (Tdap,  Td) vaccine. You may need a Td booster every 10 years.  Zoster vaccine. You may need this after age 39.  Pneumococcal 13-valent conjugate (PCV13) vaccine. One dose is recommended after age 45.  Pneumococcal polysaccharide (PPSV23) vaccine. One dose is recommended after age 53. Talk to your health care provider about which screenings and vaccines you need and how often you need them. This information is not intended to replace advice given to you by your health care provider. Make sure you discuss any questions you have with your health care provider. Document Released: 03/04/2015 Document Revised: 10/26/2015 Document Reviewed: 12/07/2014 Elsevier Interactive Patient Education  2017 [employer] Prevention in the Home Falls can cause injuries. They can happen to people of all ages. There are many things you can do to make your home safe and to help prevent falls. What can I do on the outside of my home?  Regularly fix the edges of walkways and driveways and fix any cracks.  Remove anything that might make you trip as you walk through a door, such as a raised step or threshold.  Trim any bushes or trees on the path to your home.  Use bright outdoor lighting.  Clear any walking paths of anything that might make someone trip, such as rocks or tools.  Regularly check to see if handrails are loose or broken. Make sure that both sides of any steps have handrails.  Any raised decks and porches should have guardrails on the edges.  Have any leaves, snow, or ice cleared regularly.  Use sand or salt on walking paths during winter.  Clean up any spills in your garage right away. This includes oil or grease spills. What can I do in the bathroom?  Use night lights.  Install grab bars by the toilet and in the tub and shower. Do not use towel bars as grab bars.  Use non-skid mats or decals in the tub or shower.  If you need to sit down in the shower, use a plastic, non-slip  stool.  Keep the floor dry. Clean up any water that spills on the floor as soon as it happens.  Remove soap buildup in the tub or shower regularly.  Attach bath mats securely with double-sided non-slip rug tape.  Do not have throw rugs and other things on the floor that can make you trip. What can I do in the bedroom?  Use night lights.  Make sure that you have a light by your bed that is easy to reach.  Do not use any sheets or blankets that are too big for your bed. They should not hang down onto the floor.  Have a firm chair that has side arms. You can use this for support while you get dressed.  Do not have throw rugs and other things on the floor that can make you trip. What can I do in the kitchen?  Clean up any spills right away.  Avoid walking on wet floors.  Keep items that you use a lot in easy-to-reach places.  If you need to reach something above you, use a strong step stool that has a grab bar.  Keep electrical cords out of the way.  Do not use floor polish or wax that makes floors slippery. If you must use wax, use non-skid  floor wax.  Do not have throw rugs and other things on the floor that can make you trip. What can I do with my stairs?  Do not leave any items on the stairs.  Make sure that there are handrails on both sides of the stairs and use them. Fix handrails that are broken or loose. Make sure that handrails are as long as the stairways.  Check any carpeting to make sure that it is firmly attached to the stairs. Fix any carpet that is loose or worn.  Avoid having throw rugs at the top or bottom of the stairs. If you do have throw rugs, attach them to the floor with carpet tape.  Make sure that you have a light switch at the top of the stairs and the bottom of the stairs. If you do not have them, ask someone to add them for you. What else can I do to help prevent falls?  Wear shoes that:  Do not have high heels.  Have rubber bottoms.  Are  comfortable and fit you well.  Are closed at the toe. Do not wear sandals.  If you use a stepladder:  Make sure that it is fully opened. Do not climb a closed stepladder.  Make sure that both sides of the stepladder are locked into place.  Ask someone to hold it for you, if possible.  Clearly mark and make sure that you can see:  Any grab bars or handrails.  First and last steps.  Where the edge of each step is.  Use tools that help you move around (mobility aids) if they are needed. These include:  Canes.  Walkers.  Scooters.  Crutches.  Turn on the lights when you go into a dark area. Replace any light bulbs as soon as they burn out.  Set up your furniture so you have a clear path. Avoid moving your furniture around.  If any of your floors are uneven, fix them.  If there are any pets around you, be aware of where they are.  Review your medicines with your doctor. Some medicines can make you feel dizzy. This can increase your chance of falling. Ask your doctor what other things that you can do to help prevent falls. This information is not intended to replace advice given to you by your health care provider. Make sure you discuss any questions you have with your health care provider. Document Released: 12/02/2008 Document Revised: 07/14/2015 Document Reviewed: 03/12/2014 Elsevier Interactive Patient Education  2017 [employer].

## 2019-12-16 NOTE — Progress Notes (Signed)
Subjective:   Catherine Orr is a 64 y.o. female who presents for Medicare Annual (Subsequent) preventive examination.  Review of Systems: N/A      I connected with the patient today by telephone and verified that I am speaking with the correct person using two identifiers. Location patient: home Location nurse: work Persons participating in the telephone visit: patient, nurse.   I discussed the limitations, risks, security and privacy concerns of performing an evaluation and management service by telephone and the availability of in person appointments. I also discussed with the patient that there may be a patient responsible charge related to this service. The patient expressed understanding and verbally consented to this telephonic visit.        Cardiac Risk Factors include: advanced age (>68men, >73 women);diabetes mellitus     Objective:    Today's Vitals   There is no height or weight on file to calculate BMI.  Advanced Directives 12/16/2019 08/13/2017 11/07/2015  Does Patient Have a Medical Advance Directive? No Yes Yes  Type of Advance Directive - Healthcare Power of [place];Living will Healthcare Power of Attorney  Does patient want to make changes to medical advance directive? - - No - Patient declined  Copy of Healthcare Power of Attorney in Chart? - No - copy requested No - copy requested  Would patient like information on creating a medical advance directive? No - Patient declined - -    Current Medications (verified) Outpatient Encounter Medications as of 12/16/2019  Medication Sig  . metFORMIN (GLUCOPHAGE) 1000 MG tablet TAKE 1 TABLET BY MOUTH TWICE A DAY WITH A MEAL FOR DIABETES  . rosuvastatin (CRESTOR) 5 MG tablet TAKE ONE TABLET BY MOUTH ONCE EVERY EVENING FOR CHOLESTEROL   No facility-administered encounter medications on file as of 12/16/2019.    Allergies (verified) Patient has no known allergies.   History: Past Medical History:  Diagnosis  Date  . 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 Cellulitis and abscess of face   . Hyperglycemia 11/08/2014  . MR (mental retardation)   . Other malaise and fatigue   . Other screening breast examination   . Routine general medical examination at a health care facility   . Screening for lipoid disorders   . Screening for malignant neoplasm of the cervix   . Special screening for malignant neoplasms, colon    History reviewed. No pertinent surgical history. Family History  Problem Relation Age of Onset  . Diabetes Mother   . Breast cancer Sister 45   Social History   Socioeconomic History  . Marital status: Single    Spouse name: Not on file  . Number of children: Not on file  . Years of education: Not on file  . Highest education level: Not on file  Occupational History  . Not on file  Tobacco Use  . Smoking status: Never Smoker  . Smokeless tobacco: Never Used  Vaping Use  . Vaping Use: Never used  Substance and Sexual Activity  . Alcohol use: No  . Drug use: No  . Sexual activity: Not Currently  Other Topics Concern  . Not on file  Social History Narrative   Lives in group home   Social Determinants of Health   Financial Resource Strain: Low Risk   . Difficulty of Paying Living Expenses: Not hard at all  Food Insecurity: No Food Insecurity  . Worried About Programme researcher, broadcasting/film/video in the Last Year: Never true  . Ran Out of Food  in the Last Year: Never true  Transportation Needs: No Transportation Needs  . Lack of Transportation (Medical): No  . Lack of Transportation (Non-Medical): No  Physical Activity: Sufficiently Active  . Days of Exercise per Week: 7 days  . Minutes of Exercise per Session: 30 min  Stress: No Stress Concern Present  . Feeling of Stress : Not at all  Social Connections:   . Frequency of Communication with Friends and Family: Not on file  . Frequency of Social Gatherings with Friends and Family: Not on file  . Attends Religious Services:  Not on file  . Active Member of Clubs or Organizations: Not on file  . Attends Banker Meetings: Not on file  . Marital Status: Not on file    Tobacco Counseling Counseling given: Not Answered   Clinical Intake:  Pre-visit preparation completed: Yes  Pain : No/denies pain     Nutritional Risks: None Diabetes: Yes CBG done?: No Did pt. bring in CBG monitor from home?: No  How often do you need to have someone help you when you read instructions, pamphlets, or other written materials from your doctor or pharmacy?: 1 - Never What is the last grade level you completed in school?: 12th  Diabetic: Yes Nutrition Risk Assessment:  Has the patient had any N/V/D within the last 2 months?  No  Does the patient have any non-healing wounds?  No  Has the patient had any unintentional weight loss or weight gain?  No   Diabetes:  Is the patient diabetic?  Yes  If diabetic, was a CBG obtained today?  No  Did the patient bring in their glucometer from home?  No  How often do you monitor your CBG's? When needed.   Financial Strains and Diabetes Management:  Are you having any financial strains with the device, your supplies or your medication? No .  Does the patient want to be seen by Chronic Care Management for management of their diabetes?  No  Would the patient like to be referred to a Nutritionist or for Diabetic Management?  No   Diabetic Exams:  Diabetic Eye Exam: Completed 10/13/2019 Diabetic Foot Exam: Completed 06/09/2019   Interpreter Needed?: No  Information entered by :: CJohnson, LPN   Activities of Daily Living In your present state of health, do you have any difficulty performing the following activities: 12/16/2019 12/15/2019  Hearing? N N  Vision? N N  Difficulty concentrating or making decisions? N N  Walking or climbing stairs? N N  Dressing or bathing? N N  Doing errands, shopping? 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 N  -  In the past six months, have you accidently leaked urine? N -  Do you have problems with loss of bowel control? N -  Managing your Medications? N -  Managing your Finances? N -  Housekeeping or managing your Housekeeping? N -  Some recent data might be hidden    Patient Care Team: Doreene Nest, NP as PCP - General (Internal Medicine) Hart Carwin, MD (Inactive) (Gastroenterology)  Indicate any recent Medical Services you may have received from other than [facility] providers in the past year (date may be approximate).     Assessment:   This is a routine wellness examination for [facility]  Hearing/Vision screen  Hearing Screening   125Hz  250Hz  500Hz  1000Hz  2000Hz  3000Hz  4000Hz  6000Hz  8000Hz   Right ear:  Left ear:           Vision Screening Comments: Patient gets annual eye exams   Dietary issues and exercise activities discussed: Current Exercise Habits: Home exercise routine, Type of exercise: walking, Time (Minutes): 30, Frequency (Times/Week): 7, Weekly Exercise (Minutes/Week): 210, Intensity: Moderate, Exercise limited by: None identified  Goals    . Increase physical activity     Starting 11/07/2015, I will attempt to drink at least 6-8 glasses of water daily.     . Patient Stated     Starting 08/13/2017, I will continue to take medications as prescribed.      . Patient Stated     12/16/2019, I will continue to walk everyday for about 20-30 minutes.       Depression Screen PHQ 2/9 Scores 12/16/2019 12/15/2019 08/13/2017 11/07/2015 11/08/2014  PHQ - 2 Score 0 0 0 0 0  PHQ- 9 Score 0 0 0 - -    Fall Risk Fall Risk  12/16/2019 12/15/2019 08/13/2017 11/07/2015 11/08/2014  Falls in the past year? 0 0 No No No  Number falls in past yr: 0 0 - - -  Injury with Fall? 0 0 - - -  Risk for fall due to : No Fall Risks - - - -  Follow up Falls evaluation completed;Falls prevention discussed - - - -    Any stairs in or around the home? Yes  If so, are there any without  handrails? No  Home free of loose throw rugs in walkways, pet beds, electrical cords, etc? Yes  Adequate lighting in your home to reduce risk of falls? Yes   ASSISTIVE DEVICES UTILIZED TO PREVENT FALLS:  Life alert? No  Use of a cane, walker or w/c? No  Grab bars in the bathroom? No  Shower chair or bench in shower? Yes  Elevated toilet seat or a handicapped toilet? No   TIMED UP AND GO:  Was the test performed? N/A, telephonic visit .   Cognitive Function: MMSE - Mini Mental State Exam 12/16/2019 08/13/2017 11/07/2015  Orientation to time 5 5 5   Orientation to Place 5 5 5   Registration 3 3 3   Attention/ Calculation 0 0 0  Recall 3 3 2   Recall-comments - - pt was unable to recall 1 of 3 words  Language- name 2 objects - 0 0  Language- repeat 1 1 1   Language- follow 3 step command - 3 3  Language- read & follow direction - 0 0  Write a sentence - 0 0  Copy design - 0 0  Total score - 20 19  Mini Cog  Mini-Cog screen was completed. Maximum score is 22. A value of 0 denotes this part of the MMSE was not completed or the patient failed this part of the Mini-Cog screening.       Immunizations Immunization History  Administered Date(s) Administered  . Influenza,inj,Quad PF,6+ Mos 11/08/2014, 11/07/2015, 11/24/2016, 12/05/2017  . Influenza-Unspecified 11/16/2013  . Pneumococcal Polysaccharide-23 02/13/2017  . Td 06/08/2009  . Zoster Recombinat (Shingrix) 09/06/2018    TDAP status: declined Flu Vaccine status: Declined, Education has been provided regarding the importance of this vaccine but patient still declined. Advised may receive this vaccine at local pharmacy or Health Dept. Aware to provide a copy of the vaccination record if obtained from local pharmacy or Health Dept. Verbalized acceptance and understanding. Pneumococcal vaccine status: Declined,  Education has been provided regarding the importance of this vaccine but patient still declined. Advised may receive  this  vaccine at local pharmacy or Health Dept. Aware to provide a copy of the vaccination record if obtained from local pharmacy or Health Dept. Verbalized acceptance and understanding.  Covid-19 vaccine status: Declined, Education has been provided regarding the importance of this vaccine but patient still declined. Advised may receive this vaccine at local pharmacy or Health Dept.or vaccine clinic. Aware to provide a copy of the vaccination record if obtained from local pharmacy or Health Dept. Verbalized acceptance and understanding.  Qualifies for Shingles Vaccine? Yes   Zostavax completed No   Shingrix Completed?:No, declined  Screening Tests Health Maintenance  Topic Date Due  . DEXA SCAN  Never done  . COLONOSCOPY  08/25/2019  . COVID-19 Vaccine (1) 12/31/2019 (Originally 12/29/1967)  . INFLUENZA VACCINE  05/19/2020 (Originally 09/20/2019)  . TETANUS/TDAP  12/14/2020 (Originally 06/09/2019)  . PNA vac Low Risk Adult (1 of 2 - PCV13) 12/14/2020 (Originally 12/30/2018)  . HEMOGLOBIN A1C  06/07/2020  . FOOT EXAM  06/08/2020  . PAP SMEAR-Modifier  08/28/2020  . MAMMOGRAM  09/22/2020  . OPHTHALMOLOGY EXAM  10/12/2020  . URINE MICROALBUMIN  12/07/2020  . Hepatitis C Screening  Completed  . HIV Screening  Completed    Health Maintenance  Health Maintenance Due  Topic Date Due  . DEXA SCAN  Never done  . COLONOSCOPY  08/25/2019    Colorectal cancer screening: Cologuard ordered 12/15/2019 Mammogram status: Ordered 12/15/2019. Pt provided with contact info and advised to call to schedule appt.  Bone Density status: Ordered 12/15/2019. Pt provided with contact info and advised to call to schedule appt.  Lung Cancer Screening: (Low Dose CT Chest recommended if Age 43-80 years, 30 pack-year currently smoking OR have quit w/in 15years.) does not qualify.    Additional Screening:  Hepatitis C Screening: does qualify; Completed 11/01/2014  Vision Screening: Recommended annual ophthalmology  exams for early detection of glaucoma and other disorders of the eye. Is the patient up to date with their annual eye exam?  Yes  Who is the provider or what is the name of the office in which the patient attends annual eye exams? [facility]  If pt is not established with a provider, would they like to be referred to a provider to establish care? No .   Dental Screening: Recommended annual dental exams for proper oral hygiene  Community Resource Referral / Chronic Care Management: CRR required this visit?  No   CCM required this visit?  No      Plan:     I have personally reviewed and noted the following in the patient's chart:   . Medical and social history . Use of alcohol, tobacco or illicit drugs  . Current medications and supplements . Functional ability and status . Nutritional status . Physical activity . Advanced directives . List of other physicians . Hospitalizations, surgeries, and ER visits in previous 12 months . Vitals . Screenings to include cognitive, depression, and falls . Referrals and appointments  In addition, I have reviewed and discussed with patient certain preventive protocols, quality metrics, and best practice recommendations. A written personalized care plan for preventive services as well as general preventive health recommendations were provided to patient.   Due to this being a telephonic visit, the after visit summary with patients personalized plan was offered to patient via office or my-chart.  Patient preferred to pick up at office at next visit or via mychart.   Janalyn Shy, LPN   [id number]

## 2019-12-16 NOTE — Progress Notes (Signed)
PCP notes:  Health Maintenance: All vaccines declined Cologuard- ordered 12/15/2019 Mammogram- ordered 12/15/2019 Dexa- ordered 12/15/2019  Abnormal Screenings: MMSE- score 17   Patient concerns: none   Nurse concerns: none   Next PCP appt.: none

## 2019-12-21 NOTE — Addendum Note (Signed)
Addended by: Donnamarie Poag on: 12/21/2019 03:36 PM   Modules accepted: Orders

## 2019-12-31 DIAGNOSIS — Z1211 Encounter for screening for malignant neoplasm of colon: Secondary | ICD-10-CM | POA: Diagnosis not present

## 2019-12-31 LAB — COLOGUARD: Cologuard: NEGATIVE

## 2020-01-11 LAB — COLOGUARD: COLOGUARD: NEGATIVE

## 2020-01-11 LAB — EXTERNAL GENERIC LAB PROCEDURE: COLOGUARD: NEGATIVE

## 2020-01-12 ENCOUNTER — Encounter: Payer: Self-pay | Admitting: Primary Care

## 2020-01-12 LAB — COLOGUARD: Cologuard: NEGATIVE

## 2020-02-03 ENCOUNTER — Other Ambulatory Visit: Payer: Self-pay | Admitting: Primary Care

## 2020-02-03 DIAGNOSIS — E785 Hyperlipidemia, unspecified: Secondary | ICD-10-CM

## 2020-02-15 ENCOUNTER — Other Ambulatory Visit: Payer: Medicare HMO

## 2020-02-16 ENCOUNTER — Other Ambulatory Visit: Payer: Medicare HMO

## 2020-06-14 ENCOUNTER — Encounter: Payer: Self-pay | Admitting: Primary Care

## 2020-06-14 ENCOUNTER — Ambulatory Visit: Payer: Medicare HMO | Admitting: Primary Care

## 2020-06-14 ENCOUNTER — Other Ambulatory Visit: Payer: Self-pay

## 2020-06-14 VITALS — BP 138/84 | HR 90 | Temp 97.6°F | Ht 61.0 in | Wt 173.0 lb

## 2020-06-14 DIAGNOSIS — E785 Hyperlipidemia, unspecified: Secondary | ICD-10-CM

## 2020-06-14 DIAGNOSIS — E119 Type 2 diabetes mellitus without complications: Secondary | ICD-10-CM

## 2020-06-14 LAB — POCT GLYCOSYLATED HEMOGLOBIN (HGB A1C): Hemoglobin A1C: 6 % — AB (ref 4.0–5.6)

## 2020-06-14 LAB — LIPID PANEL
Cholesterol: 138 mg/dL (ref 0–200)
HDL: 45.1 mg/dL (ref >39.00)
LDL Cholesterol: 57 mg/dL (ref 0–99)
NonHDL: 93.22
Total CHOL/HDL Ratio: 3
Triglycerides: 179 mg/dL — ABNORMAL HIGH (ref 0.0–149.0)
VLDL: 35.8 mg/dL (ref 0.0–40.0)

## 2020-06-14 NOTE — Assessment & Plan Note (Signed)
Compliant to rosuvastatin 5 mg, repeat lipids pending.

## 2020-06-14 NOTE — Progress Notes (Signed)
Subjective:    Patient ID: Catherine Orr, female    DOB: 1955/08/27, 65 y.o.   MRN: [id number]  HPI  Catherine Orr is a very pleasant 65 y.o. female with a history of type 2 diabetes, hyperlipidemia who presents today for follow up of diabetes.  Current medications include: Metformin 1000 mg BID.  She is checking her blood glucose 0 times daily.   Last A1C: 6.5 in October 2021, 6.0 today Last Eye Exam: UTD Last Foot Exam: UTD Pneumonia Vaccination: 2021 ACE/ARB: None. Urine micro UTD Statin: Crestor  BP Readings from Last 3 Encounters:  06/14/20 138/84  12/15/19 124/80  06/09/19 126/82   Wt Readings from Last 3 Encounters:  06/14/20 173 lb (78.5 kg)  12/15/19 172 lb (78 kg)  06/09/19 181 lb (82.1 kg)    Review of Systems  Eyes: Negative for visual disturbance.  Respiratory: Negative for shortness of breath.   Cardiovascular: Negative for chest pain.  Neurological: Negative for dizziness.         Past Medical History:  Diagnosis Date  . 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 Cellulitis and abscess of face   . Hyperglycemia 11/08/2014  . MR (mental retardation)   . Other malaise and fatigue   . Other screening breast examination   . Routine general medical examination at a health care facility   . Screening for lipoid disorders   . Screening for malignant neoplasm of the cervix   . Special screening for malignant neoplasms, colon     Social History   Socioeconomic History  . Marital status: Single    Spouse name: Not on file  . Number of children: Not on file  . Years of education: Not on file  . Highest education level: Not on file  Occupational History  . Not on file  Tobacco Use  . Smoking status: Never Smoker  . Smokeless tobacco: Never Used  Vaping Use  . Vaping Use: Never used  Substance and Sexual Activity  . Alcohol use: No  . Drug use: No  . Sexual activity: Not Currently  Other Topics Concern  . Not on file  Social History  Narrative   Lives in group home   Social Determinants of Health   Financial Resource Strain: Low Risk   . Difficulty of Paying Living Expenses: Not hard at all  Food Insecurity: No Food Insecurity  . Worried About Programme researcher, broadcasting/film/video in the Last Year: Never true  . Ran Out of Food in the Last Year: Never true  Transportation Needs: No Transportation Needs  . Lack of Transportation (Medical): No  . Lack of Transportation (Non-Medical): No  Physical Activity: Sufficiently Active  . Days of Exercise per Week: 7 days  . Minutes of Exercise per Session: 30 min  Stress: No Stress Concern Present  . Feeling of Stress : Not at all  Social Connections: Not on file  Intimate Partner Violence: Not At Risk  . Fear of Current or Ex-Partner: No  . Emotionally Abused: No  . Physically Abused: No  . Sexually Abused: No    History reviewed. No pertinent surgical history.  Family History  Problem Relation Age of Onset  . Diabetes Mother   . Breast cancer Sister 53    No Known Allergies  Current Outpatient Medications on File Prior to Visit  Medication Sig Dispense Refill  . metFORMIN (GLUCOPHAGE) 1000 MG tablet TAKE 1 TABLET BY MOUTH TWICE A DAY WITH A MEAL FOR DIABETES  180 tablet 3  . rosuvastatin (CRESTOR) 5 MG tablet TAKE ONE TABLET BY MOUTH ONCE EVERY EVENING FOR CHOLESTEROL 90 tablet 3   No current facility-administered medications on file prior to visit.    BP 138/84   Pulse 90   Temp 97.6 F (36.4 C) (Temporal)   Ht 5\' 1"  (1.549 m)   Wt 173 lb (78.5 kg)   SpO2 95%   BMI 32.69 kg/m  Objective:   Physical Exam Cardiovascular:     Rate and Rhythm: Normal rate and regular rhythm.  Pulmonary:     Effort: Pulmonary effort is normal.     Breath sounds: Normal breath sounds.  Musculoskeletal:     Cervical back: Neck supple.  Skin:    General: Skin is warm and dry.           Assessment & Plan:      This visit occurred during the SARS-CoV-2 public health  emergency.  Safety protocols were in place, including screening questions prior to the visit, additional usage of staff PPE, and extensive cleaning of exam room while observing appropriate contact time as indicated for disinfecting solutions.

## 2020-06-14 NOTE — Progress Notes (Signed)
poc

## 2020-06-14 NOTE — Patient Instructions (Addendum)
Stop by the lab prior to leaving today. I will notify you of your results once received.   Please schedule a physical to meet with me in 6 months.   It was a pleasure to see you today!   

## 2020-06-14 NOTE — Assessment & Plan Note (Signed)
Well controlled in the office today with A1C of 6.0. Commended her on lifestyle changes!  Doing well on rosuvastatin 5 mg. Foot and eye exams UTD. Pneumonia vaccine UTD. Urine micro UTD.  Follow up in 6 months.

## 2020-08-17 ENCOUNTER — Other Ambulatory Visit: Payer: Self-pay | Admitting: Primary Care

## 2020-08-17 DIAGNOSIS — E119 Type 2 diabetes mellitus without complications: Secondary | ICD-10-CM

## 2020-08-17 DIAGNOSIS — E785 Hyperlipidemia, unspecified: Secondary | ICD-10-CM

## 2020-10-18 DIAGNOSIS — H11153 Pinguecula, bilateral: Secondary | ICD-10-CM | POA: Diagnosis not present

## 2020-10-18 DIAGNOSIS — H2513 Age-related nuclear cataract, bilateral: Secondary | ICD-10-CM | POA: Diagnosis not present

## 2020-10-18 DIAGNOSIS — E119 Type 2 diabetes mellitus without complications: Secondary | ICD-10-CM | POA: Diagnosis not present

## 2020-10-18 LAB — HM DIABETES EYE EXAM

## 2020-10-25 ENCOUNTER — Encounter: Payer: Self-pay | Admitting: Primary Care

## 2020-12-13 ENCOUNTER — Other Ambulatory Visit: Payer: Medicare HMO

## 2020-12-19 NOTE — Progress Notes (Signed)
Subjective:   Catherine Orr is a 65 y.o. female who presents for Medicare Annual (Subsequent) preventive examination. I connected with Gwenevere Ghazi today by telephone and verified that I am speaking with the correct person using two identifiers. Location patient: home Location provider: work Persons participating in the virtual visit: patient, Engineer, civil (consulting).    I discussed the limitations, risks, security and privacy concerns of performing an evaluation and management service by telephone and the availability of in person appointments. I also discussed with the patient that there may be a patient responsible charge related to this service. The patient expressed understanding and verbally consented to this telephonic visit.    Interactive audio and video telecommunications were attempted between this provider and patient, however failed, due to patient having technical difficulties OR patient did not have access to video capability.  We continued and completed visit with audio only.  Some vital signs may be absent or patient reported.   Time Spent with patient on telephone encounter: 35 minutes   Review of Systems     Cardiac Risk Factors include: advanced age (>50men, >41 women);diabetes mellitus;dyslipidemia     Objective:    Today's Vitals   12/20/20 0815  Weight: 173 lb (78.5 kg)  Height: 5\' 1"  (1.549 m)   Body mass index is 32.69 kg/m.  Advanced Directives 12/20/2020 12/16/2019 08/13/2017 11/07/2015  Does Patient Have a Medical Advance Directive? Yes No Yes Yes  Type of 11/09/2015 of [place];Living will - Healthcare Power of [place];Living will Healthcare Power of Attorney  Does patient want to make changes to medical advance directive? Yes (MAU/Ambulatory/Procedural Areas - Information given) - - No - Patient declined  Copy of Healthcare Power of Attorney in Chart? No - copy requested - No - copy requested No - copy requested  Would patient like  information on creating a medical advance directive? - No - Patient declined - -    Current Medications (verified) Outpatient Encounter Medications as of 12/20/2020  Medication Sig   metFORMIN (GLUCOPHAGE) 1000 MG tablet TAKE 1 TABLET BY MOUTH TWICE A DAY FOR DIABETES   rosuvastatin (CRESTOR) 5 MG tablet TAKE ONE TABLET BY MOUTH EVERY EVENING FOR CHOLESTEROL   No facility-administered encounter medications on file as of 12/20/2020.    Allergies (verified) Patient has no known allergies.   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 No pertinent surgical history. Family History  Problem Relation Age of Onset   Diabetes Mother    Breast cancer Sister 50   Social History   Socioeconomic History   Marital status: Single    Spouse name: Not on file   Number of children: Not on file   Years of education: Not on file   Highest education level: Not on file  Occupational History   Not on file  Tobacco Use   Smoking status: Never   Smokeless tobacco: Never  Vaping Use   Vaping Use: Never used  Substance and Sexual Activity   Alcohol use: No  Drug use: No   Sexual activity: Not Currently  Other Topics Concern   Not on file  Social History Narrative   Lives in group home   Social Determinants of Health   Financial Resource Strain: Low Risk    Difficulty of Paying Living Expenses: Not hard at all  Food Insecurity: No Food Insecurity   Worried About Programme researcher, broadcasting/film/video in the Last Year: Never true   Barista in the Last Year: Never true   Transportation Needs: No Transportation Needs   Lack of Transportation (Medical): No   Lack of Transportation (Non-Medical): No  Physical Activity: Not on file  Stress: No Stress Concern Present   Feeling of Stress : Not at all  Social Connections: Moderately Integrated   Frequency of Communication with Friends and Family: Once a week   Frequency of Social Gatherings with Friends and Family: More than three times a week   Attends Religious Services: More than 4 times per year   Active Member of [employer] or Organizations: Yes   Attends Engineer, structural: More than 4 times per year   Marital Status: Never married    Tobacco Counseling Counseling given: Not Answered   Clinical Intake:  Pre-visit preparation completed: Yes  Pain : No/denies pain     BMI - recorded: 32.7 Nutritional Status: BMI > 30  Obese Nutritional Risks: None Diabetes: No  How often do you need to have someone help you when you read instructions, pamphlets, or other written materials from your doctor or pharmacy?: 1 - Never  Diabetes:  Is the patient diabetic?  Yes  If diabetic, was a CBG obtained today?  No , visit completed over the phone. Did the patient bring in their glucometer from home?  No , visit completed over the phone. How often do you monitor your CBG's? Patient not checking blood sugars. Patient is taking oral medication.   Financial Strains and Diabetes Management:  Are you having any financial strains with the device, your supplies or your medication? No .  Does the patient want to be seen by Chronic Care Management for management of their diabetes?  No  Would the patient like to be referred to a Nutritionist or for Diabetic Management?  No   Diabetic Exams:  Diabetic Eye Exam: Completed 10/18/20.  Diabetic Foot Exam: Completed 05/25/20.  Interpreter Needed?: No  Information entered by :: Sharen Heck LPN   Activities of Daily Living In your present state of health, do  you have any difficulty performing the following activities: 12/20/2020  Hearing? N  Vision? N  Difficulty concentrating or making decisions? N  Walking or climbing stairs? N  Dressing or bathing? N  Doing errands, shopping? N  Preparing Food and eating ? N  Using the Toilet? N  In the past six months, have you accidently leaked urine? N  Do you have problems with loss of bowel control? N  Managing your Medications? N  Managing your Finances? N  Housekeeping or managing your Housekeeping? N  Some recent data might be hidden    Patient Care Team: Doreene Nest, NP as PCP - General (Internal Medicine) Hart Carwin, MD (Inactive) (Gastroenterology)  Indicate any recent Medical Services you may have received from other than [facility] providers in the past year (date may be approximate).     Assessment:   This is a routine wellness examination for [facility].  Hearing/Vision screen Hearing Screening - Comments:: No issues Vision Screening -  Comments:: Last exam 2022, Dr Lavona Mound, Wears reading glasses  Dietary issues and exercise activities discussed: Current Exercise Habits: Home exercise routine, Type of exercise: walking, Time (Minutes): 30, Frequency (Times/Week): 7, Weekly Exercise (Minutes/Week): 210, Intensity: Moderate   Goals Addressed             This Visit's Progress    Patient Stated       Maintain current goal drinking more water.       Depression Screen PHQ 2/9 Scores 12/20/2020 12/16/2019 12/15/2019 08/13/2017 11/07/2015 11/08/2014  PHQ - 2 Score 0 0 0 0 0 0  PHQ- 9 Score - 0 0 0 - -    Fall Risk Fall Risk  12/20/2020 12/16/2019 12/15/2019 08/13/2017 11/07/2015  Falls in the past year? 0 0 0 No No  Number falls in past yr: 0 0 0 - -  Injury with Fall? 0 0 0 - -  Risk for fall due to : No Fall Risks No Fall Risks - - -  Follow up Falls prevention discussed Falls evaluation completed;Falls prevention discussed - - -    FALL RISK PREVENTION PERTAINING TO THE  HOME:  Any stairs in or around the home? No  If so, are there any without handrails? No  Home free of loose throw rugs in walkways, pet beds, electrical cords, etc? Yes  Adequate lighting in your home to reduce risk of falls? Yes   ASSISTIVE DEVICES UTILIZED TO PREVENT FALLS:  Life alert? No  Use of a cane, walker or w/c? No  Grab bars in the bathroom? Yes  Shower chair or bench in shower? Yes  Elevated toilet seat or a handicapped toilet? Yes   TIMED UP AND GO:  Was the test performed? No , visit completed over the phone.   Cognitive Function: Normal cognitive status assessed by this Nurse Health Advisor. No abnormalities found.   MMSE - Mini Mental State Exam 12/16/2019 08/13/2017 11/07/2015  Orientation to time 5 5 5   Orientation to Place 5 5 5   Registration 3 3 3   Attention/ Calculation 0 0 0  Recall 3 3 2   Recall-comments - - pt was unable to recall 1 of 3 words  Language- name 2 objects - 0 0  Language- repeat 1 1 1   Language- follow 3 step command - 3 3  Language- read & follow direction - 0 0  Write a sentence - 0 0  Copy design - 0 0  Total score - 20 19        Immunizations Immunization History  Administered Date(s) Administered   Influenza,inj,Quad PF,6+ Mos 11/08/2014, 11/07/2015, 11/24/2016, 12/05/2017   Influenza-Unspecified 11/16/2013   Pneumococcal Polysaccharide-23 02/13/2017, 12/15/2019   Td 06/08/2009   Zoster Recombinat (Shingrix) 09/06/2018    TDAP status: Due, Education has been provided regarding the importance of this vaccine. Advised may receive this vaccine at local pharmacy or Health Dept. Aware to provide a copy of the vaccination record if obtained from local pharmacy or Health Dept. Verbalized acceptance and understanding.  Flu Vaccine status: Declined, Education has been provided regarding the importance of this vaccine but patient still declined. Advised may receive this vaccine at local pharmacy or Health Dept. Aware to provide a copy  of the vaccination record if obtained from local pharmacy or Health Dept. Verbalized acceptance and understanding.  Pneumococcal vaccine status: Up to date  Covid-19 vaccine status: Declined, Education has been provided regarding the importance of this vaccine but patient still declined. Advised may receive this vaccine  at local pharmacy or Health Dept.or vaccine clinic. Aware to provide a copy of the vaccination record if obtained from local pharmacy or Health Dept. Verbalized acceptance and understanding.  Qualifies for Shingles Vaccine? Yes   Zostavax completed No   Shingrix Completed?: Yes  Screening Tests Health Maintenance  Topic Date Due   COVID-19 Vaccine (1) Never done   Zoster Vaccines- Shingrix (2 of 2) 11/01/2018   DEXA SCAN  Never done   TETANUS/TDAP  06/09/2019   INFLUENZA VACCINE  09/19/2020   MAMMOGRAM  09/22/2020   URINE MICROALBUMIN  12/07/2020   Pneumonia Vaccine 57+ Years old (2 - PCV) 12/14/2020   HEMOGLOBIN A1C  12/14/2020   FOOT EXAM  06/14/2021   OPHTHALMOLOGY EXAM  10/18/2021   Fecal DNA (Cologuard)  01/01/2023   Hepatitis C Screening  Completed   HPV VACCINES  Aged Out    Health Maintenance  Health Maintenance Due  Topic Date Due   COVID-19 Vaccine (1) Never done   Zoster Vaccines- Shingrix (2 of 2) 11/01/2018   DEXA SCAN  Never done   TETANUS/TDAP  06/09/2019   INFLUENZA VACCINE  09/19/2020   MAMMOGRAM  09/22/2020   URINE MICROALBUMIN  12/07/2020   Pneumonia Vaccine 43+ Years old (2 - PCV) 12/14/2020   HEMOGLOBIN A1C  12/14/2020    Colorectal cancer screening: Type of screening: Cologuard. Completed 01/01/20. Repeat every 3 years  Mammogram status: Ordered 12/20/20. Pt provided with contact info and advised to call to schedule appt.   Bone Density status: Ordered 12/20/20. Pt provided with contact info and advised to call to schedule appt.  Lung Cancer Screening: (Low Dose CT Chest recommended if Age 72-80 years, 30 pack-year currently  smoking OR have quit w/in 15years.) does not qualify.     Additional Screening:  Hepatitis C Screening: Completed 11/01/14  Vision Screening: Recommended annual ophthalmology exams for early detection of glaucoma and other disorders of the eye. Is the patient up to date with their annual eye exam?  Yes  Who is the provider or what is the name of the office in which the patient attends annual eye exams? Dr. Lavona Mound   Dental Screening: Recommended annual dental exams for proper oral hygiene  Community Resource Referral / Chronic Care Management: CRR required this visit?  No   CCM required this visit?  No      Plan:     I have personally reviewed and noted the following in the patient's chart:   Medical and social history Use of alcohol, tobacco or illicit drugs  Current medications and supplements including opioid prescriptions.  Functional ability and status Nutritional status Physical activity Advanced directives List of other physicians Hospitalizations, surgeries, and ER visits in previous 12 months Vitals Screenings to include cognitive, depression, and falls Referrals and appointments  In addition, I have reviewed and discussed with patient certain preventive protocols, quality metrics, and best practice recommendations. A written personalized care plan for preventive services as well as general preventive health recommendations were provided to patient.   Due to this being a telephonic visit, the after visit summary with patients personalized plan was offered to patient via mail or my-chart. Patient would like to access on my-chart.   Janne Lab, LPN   [birth year]09[birth year]   Nurse Health Advisor  Nurse Notes: None

## 2020-12-20 ENCOUNTER — Ambulatory Visit (INDEPENDENT_AMBULATORY_CARE_PROVIDER_SITE_OTHER): Payer: Medicare HMO

## 2020-12-20 VITALS — Ht 61.0 in | Wt 173.0 lb

## 2020-12-20 DIAGNOSIS — Z780 Asymptomatic menopausal state: Secondary | ICD-10-CM | POA: Diagnosis not present

## 2020-12-20 DIAGNOSIS — Z1231 Encounter for screening mammogram for malignant neoplasm of breast: Secondary | ICD-10-CM

## 2020-12-20 DIAGNOSIS — Z0000 Encounter for general adult medical examination without abnormal findings: Secondary | ICD-10-CM | POA: Diagnosis not present

## 2020-12-20 NOTE — Patient Instructions (Signed)
Catherine Orr , Thank you for taking time to complete your Medicare Wellness Visit. I appreciate your ongoing commitment to your health goals. Please review the following plan we discussed and let me know if I can assist you in the future.   Screening recommendations/referrals: Colonoscopy: Up to date, completed 01/01/20 Due 01/01/23  Mammogram: Due, Ordered today, someone will call to schedule Bone Density: Due, Ordered today, someone will call to schedule Recommended yearly ophthalmology/optometry visit for glaucoma screening and checkup Recommended yearly dental visit for hygiene and checkup  Vaccinations: Influenza vaccine: Declined today, please call office or local pharmacy to schedule if you change your mind Pneumococcal vaccine: up to date Tdap vaccine: Due, discuss with pharmacy Shingles vaccine: up to date Covid-19: Declined today, newest booster available at pharmacy if you change your mind  Advanced directives: Please bring copy to next appointment for your chart  Conditions/risks identified: see problem list  Next appointment: Follow up in one year for your annual wellness visit    Preventive Care 65 Years and Older, Female Preventive care refers to lifestyle choices and visits with your health care provider that can promote health and wellness. What does preventive care include? A yearly physical exam. This is also called an annual well check. Dental exams once or twice a year. Routine eye exams. Ask your health care provider how often you should have your eyes checked. Personal lifestyle choices, including: Daily care of your teeth and gums. Regular physical activity. Eating a healthy diet. Avoiding tobacco and drug use. Limiting alcohol use. Practicing safe sex. Taking low-dose aspirin every day. Taking vitamin and mineral supplements as recommended by your health care provider. What happens during an annual well check? The services and screenings done by your  health care provider during your annual well check will depend on your age, overall health, lifestyle risk factors, and family history of disease. Counseling  Your health care provider may ask you questions about your: Alcohol use. Tobacco use. Drug use. Emotional well-being. Home and relationship well-being. Sexual activity. Eating habits. History of falls. Memory and ability to understand (cognition). Work and work Astronomer. Reproductive health. Screening  You may have the following tests or measurements: Height, weight, and BMI. Blood pressure. Lipid and cholesterol levels. These may be checked every 5 years, or more frequently if you are over 38 years old. Skin check. Lung cancer screening. You may have this screening every year starting at age 79 if you have a 30-pack-year history of smoking and currently smoke or have quit within the past 15 years. Fecal occult blood test (FOBT) of the stool. You may have this test every year starting at age 35. Flexible sigmoidoscopy or colonoscopy. You may have a sigmoidoscopy every 5 years or a colonoscopy every 10 years starting at age 25. Hepatitis C blood test. Hepatitis B blood test. Sexually transmitted disease (STD) testing. Diabetes screening. This is done by checking your blood sugar (glucose) after you have not eaten for a while (fasting). You may have this done every 1-3 years. Bone density scan. This is done to screen for osteoporosis. You may have this done starting at age 64. Mammogram. This may be done every 1-2 years. Talk to your health care provider about how often you should have regular mammograms. Talk with your health care provider about your test results, treatment options, and if necessary, the need for more tests. Vaccines  Your health care provider may recommend certain vaccines, such as: Influenza vaccine. This is recommended every year.  Tetanus, diphtheria, and acellular pertussis (Tdap, Td) vaccine. You may  need a Td booster every 10 years. Zoster vaccine. You may need this after age 33. Pneumococcal 13-valent conjugate (PCV13) vaccine. One dose is recommended after age 49. Pneumococcal polysaccharide (PPSV23) vaccine. One dose is recommended after age 31. Talk to your health care provider about which screenings and vaccines you need and how often you need them. This information is not intended to replace advice given to you by your health care provider. Make sure you discuss any questions you have with your health care provider. Document Released: 03/04/2015 Document Revised: 10/26/2015 Document Reviewed: 12/07/2014 Elsevier Interactive Patient Education  2017 [place] Prevention in the Home Falls can cause injuries. They can happen to people of all ages. There are many things you can do to make your home safe and to help prevent falls. What can I do on the outside of my home? Regularly fix the edges of walkways and driveways and fix any cracks. Remove anything that might make you trip as you walk through a door, such as a raised step or threshold. Trim any bushes or trees on the path to your home. Use bright outdoor lighting. Clear any walking paths of anything that might make someone trip, such as rocks or tools. Regularly check to see if handrails are loose or broken. Make sure that both sides of any steps have handrails. Any raised decks and porches should have guardrails on the edges. Have any leaves, snow, or ice cleared regularly. Use sand or salt on walking paths during winter. Clean up any spills in your garage right away. This includes oil or grease spills. What can I do in the bathroom? Use night lights. Install grab bars by the toilet and in the tub and shower. Do not use towel bars as grab bars. Use non-skid mats or decals in the tub or shower. If you need to sit down in the shower, use a plastic, non-slip stool. Keep the floor dry. Clean up any water that spills on  the floor as soon as it happens. Remove soap buildup in the tub or shower regularly. Attach bath mats securely with double-sided non-slip rug tape. Do not have throw rugs and other things on the floor that can make you trip. What can I do in the bedroom? Use night lights. Make sure that you have a light by your bed that is easy to reach. Do not use any sheets or blankets that are too big for your bed. They should not hang down onto the floor. Have a firm chair that has side arms. You can use this for support while you get dressed. Do not have throw rugs and other things on the floor that can make you trip. What can I do in the kitchen? Clean up any spills right away. Avoid walking on wet floors. Keep items that you use a lot in easy-to-reach places. If you need to reach something above you, use a strong step stool that has a grab bar. Keep electrical cords out of the way. Do not use floor polish or wax that makes floors slippery. If you must use wax, use non-skid floor wax. Do not have throw rugs and other things on the floor that can make you trip. What can I do with my stairs? Do not leave any items on the stairs. Make sure that there are handrails on both sides of the stairs and use them. Fix handrails that are broken or loose.  Make sure that handrails are as long as the stairways. Check any carpeting to make sure that it is firmly attached to the stairs. Fix any carpet that is loose or worn. Avoid having throw rugs at the top or bottom of the stairs. If you do have throw rugs, attach them to the floor with carpet tape. Make sure that you have a light switch at the top of the stairs and the bottom of the stairs. If you do not have them, ask someone to add them for you. What else can I do to help prevent falls? Wear shoes that: Do not have high heels. Have rubber bottoms. Are comfortable and fit you well. Are closed at the toe. Do not wear sandals. If you use a stepladder: Make sure  that it is fully opened. Do not climb a closed stepladder. Make sure that both sides of the stepladder are locked into place. Ask someone to hold it for you, if possible. Clearly mark and make sure that you can see: Any grab bars or handrails. First and last steps. Where the edge of each step is. Use tools that help you move around (mobility aids) if they are needed. These include: Canes. Walkers. Scooters. Crutches. Turn on the lights when you go into a dark area. Replace any light bulbs as soon as they burn out. Set up your furniture so you have a clear path. Avoid moving your furniture around. If any of your floors are uneven, fix them. If there are any pets around you, be aware of where they are. Review your medicines with your doctor. Some medicines can make you feel dizzy. This can increase your chance of falling. Ask your doctor what other things that you can do to help prevent falls. This information is not intended to replace advice given to you by your health care provider. Make sure you discuss any questions you have with your health care provider. Document Released: 12/02/2008 Document Revised: 07/14/2015 Document Reviewed: 03/12/2014 Elsevier Interactive Patient Education  2017 [employer].

## 2020-12-27 ENCOUNTER — Encounter: Payer: Self-pay | Admitting: Primary Care

## 2020-12-27 ENCOUNTER — Other Ambulatory Visit: Payer: Self-pay

## 2020-12-27 ENCOUNTER — Ambulatory Visit (INDEPENDENT_AMBULATORY_CARE_PROVIDER_SITE_OTHER): Payer: Medicare HMO | Admitting: Primary Care

## 2020-12-27 VITALS — BP 136/76 | HR 69 | Temp 98.6°F | Ht 61.0 in | Wt 174.0 lb

## 2020-12-27 DIAGNOSIS — E1165 Type 2 diabetes mellitus with hyperglycemia: Secondary | ICD-10-CM

## 2020-12-27 DIAGNOSIS — L409 Psoriasis, unspecified: Secondary | ICD-10-CM | POA: Diagnosis not present

## 2020-12-27 DIAGNOSIS — E785 Hyperlipidemia, unspecified: Secondary | ICD-10-CM

## 2020-12-27 DIAGNOSIS — Z0000 Encounter for general adult medical examination without abnormal findings: Secondary | ICD-10-CM | POA: Diagnosis not present

## 2020-12-27 LAB — CBC
HCT: 39.8 % (ref 36.0–46.0)
Hemoglobin: 13.8 g/dL (ref 12.0–15.0)
MCHC: 34.6 g/dL (ref 30.0–36.0)
MCV: 90.6 fl (ref 78.0–100.0)
Platelets: 343 10*3/uL (ref 150.0–400.0)
RBC: 4.39 Mil/uL (ref 3.87–5.11)
RDW: 12.4 % (ref 11.5–15.5)
WBC: 6.9 10*3/uL (ref 4.0–10.5)

## 2020-12-27 LAB — COMPREHENSIVE METABOLIC PANEL
ALT: 14 U/L (ref 0–35)
AST: 13 U/L (ref 0–37)
Albumin: 4.7 g/dL (ref 3.5–5.2)
Alkaline Phosphatase: 58 U/L (ref 39–117)
BUN: 28 mg/dL — ABNORMAL HIGH (ref 6–23)
CO2: 28 mEq/L (ref 19–32)
Calcium: 10.1 mg/dL (ref 8.4–10.5)
Chloride: 103 mEq/L (ref 96–112)
Creatinine, Ser: 0.69 mg/dL (ref 0.40–1.20)
GFR: 90.07 mL/min (ref >60.00)
Glucose, Bld: 120 mg/dL — ABNORMAL HIGH (ref 70–99)
Potassium: 4.4 mEq/L (ref 3.5–5.1)
Sodium: 139 mEq/L (ref 135–145)
Total Bilirubin: 0.3 mg/dL (ref 0.2–1.2)
Total Protein: 7.3 g/dL (ref 6.0–8.3)

## 2020-12-27 LAB — LIPID PANEL
Cholesterol: 174 mg/dL (ref 0–200)
HDL: 48.9 mg/dL (ref >39.00)
NonHDL: 125.26
Total CHOL/HDL Ratio: 4
Triglycerides: 235 mg/dL — ABNORMAL HIGH (ref 0.0–149.0)
VLDL: 47 mg/dL — ABNORMAL HIGH (ref 0.0–40.0)

## 2020-12-27 LAB — HEMOGLOBIN A1C: Hgb A1c MFr Bld: 6.3 % (ref 4.6–6.5)

## 2020-12-27 LAB — MICROALBUMIN / CREATININE URINE RATIO
Creatinine,U: 86.3 mg/dL
Microalb Creat Ratio: 0.9 mg/g (ref 0.0–30.0)
Microalb, Ur: 0.8 mg/dL (ref 0.0–1.9)

## 2020-12-27 LAB — LDL CHOLESTEROL, DIRECT: Direct LDL: 104 mg/dL

## 2020-12-27 NOTE — Patient Instructions (Signed)
You can apply some hydrocortisone cream to your rash twice daily for about one week.   Stop by the lab prior to leaving today. I will notify you of your results once received.   [facility] mamm[facility] and bone density scan.  Please schedule a follow up visit for 6 months.  It was a pleasure to see you today!    Preventive Care 43 Years and Older, Female Preventive care refers to lifestyle choices and visits with your health care provider that can promote health and wellness. Preventive care visits are also called wellness exams. What can I expect for my preventive care visit? Counseling Your health care provider may ask you questions about your: Medical history, including: Past medical problems. Family medical history. Pregnancy and menstrual history. History of falls. Current health, including: Memory and ability to understand (cognition). Emotional well-being. Home life and relationship well-being. Sexual activity and sexual health. Lifestyle, including: Alcohol, nicotine or tobacco, and drug use. Access to firearms. Diet, exercise, and sleep habits. Work and work Statistician. Sunscreen use. Safety issues such as seatbelt and bike helmet use. Physical exam Your health care provider will check your: Height and weight. These may be used to calculate your BMI (body mass index). BMI is a measurement that tells if you are at a healthy weight. Waist circumference. This measures the distance around your waistline. This measurement also tells if you are at a healthy weight and may help predict your risk of certain diseases, such as type 2 diabetes and high blood pressure. Heart rate and blood pressure. Body temperature. Skin for abnormal spots. What immunizations do I need? Vaccines are usually given at various ages, according to a schedule. Your health care provider will recommend vaccines for you based on your age, medical history, and lifestyle or  other factors, such as travel or where you work. What tests do I need? Screening Your health care provider may recommend screening tests for certain conditions. This may include: Lipid and cholesterol levels. Hepatitis C test. Hepatitis B test. HIV (human immunodeficiency virus) test. STI (sexually transmitted infection) testing, if you are at risk. Lung cancer screening. Colorectal cancer screening. Diabetes screening. This is done by checking your blood sugar (glucose) after you have not eaten for a while (fasting). Mammogram. Talk with your health care provider about how often you should have regular mammograms. BRCA-related cancer screening. This may be done if you have a family history of breast, ovarian, tubal, or peritoneal cancers. Bone density scan. This is done to screen for osteoporosis. Talk with your health care provider about your test results, treatment options, and if necessary, the need for more tests. Follow these instructions at home: Eating and drinking  Eat a diet that includes fresh fruits and vegetables, whole grains, lean protein, and low-fat dairy products. Limit your intake of foods with high amounts of sugar, saturated fats, and salt. Take vitamin and mineral supplements as recommended by your health care provider. Do not drink alcohol if your health care provider tells you not to drink. If you drink alcohol: Limit how much you have to 0-1 drink a day. Know how much alcohol is in your drink. In the [place] one drink equals one 12 oz bottle of beer (355 mL), one 5 oz glass of wine (148 mL), or one 1 oz glass of hard liquor (44 mL). Lifestyle Brush your teeth every morning and night with fluoride toothpaste. Floss one time each day. Exercise for at least 30 minutes  5 or more days each week. Do not use any products that contain nicotine or tobacco. These products include cigarettes, chewing tobacco, and vaping devices, such as e-cigarettes. If you need help  quitting, ask your health care provider. Do not use drugs. If you are sexually active, practice safe sex. Use a condom or other form of protection in order to prevent STIs. Take aspirin only as told by your health care provider. Make sure that you understand how much to take and what form to take. Work with your health care provider to find out whether it is safe and beneficial for you to take aspirin daily. Ask your health care provider if you need to take a cholesterol-lowering medicine (statin). Find healthy ways to manage stress, such as: Meditation, yoga, or listening to music. Journaling. Talking to a trusted person. Spending time with friends and family. Minimize exposure to UV radiation to reduce your risk of skin cancer. Safety Always wear your seat belt while driving or riding in a vehicle. Do not drive: If you have been drinking alcohol. Do not ride with someone who has been drinking. When you are tired or distracted. While texting. If you have been using any mind-altering substances or drugs. Wear a helmet and other protective equipment during sports activities. If you have firearms in your house, make sure you follow all gun safety procedures. What's next? Visit your health care provider once a year for an annual wellness visit. Ask your health care provider how often you should have your eyes and teeth checked. Stay up to date on all vaccines. This information is not intended to replace advice given to you by your health care provider. Make sure you discuss any questions you have with your health care provider. Document Revised: 08/03/2020 Document Reviewed: 08/03/2020 Elsevier Patient Education  [place].

## 2020-12-27 NOTE — Assessment & Plan Note (Signed)
Declines influenza vaccine. She's unsure if she's received Prevar vaccine. Shingles UTD.  Mammogram and bone density scans due, ordered and pending.  Cologuard UTD, due 2024.  Discussed the importance of a healthy diet and regular exercise in order for weight loss, and to reduce the risk of further co-morbidity.  Exam today stable.  Labs pending.

## 2020-12-27 NOTE — Progress Notes (Signed)
Subjective:    Patient ID: Catherine Orr, female    DOB: 1955/11/18, 65 y.o.   MRN: [id number]  HPI  Catherine Orr is a very pleasant 65 y.o. female who presents today for complete physical and follow up of chronic conditions.   Immunizations: -Tetanus: 2011 -Influenza: Declines  -Covid-19: Has not completed  -Shingles: Completed series  -Pneumonia: Pneumovax in 2021 and 2018  Diet: Fair diet.  Exercise: No regular exercise.  Eye exam: Completes annually  Dental exam: Completes semi-annually   Mammogram: Completed in 2020, she will schedule  Colonoscopy: Completed Cologuard in 2021  BP Readings from Last 3 Encounters:  12/27/20 136/76  06/14/20 138/84  12/15/19 124/80       Review of Systems  Constitutional:  Negative for unexpected weight change.  HENT:  Negative for rhinorrhea.   Respiratory:  Negative for cough and shortness of breath.   Cardiovascular:  Negative for chest pain.  Gastrointestinal:  Negative for constipation and diarrhea.  Genitourinary:  Negative for difficulty urinating.  Musculoskeletal:  Negative for arthralgias and myalgias.  Skin:  Positive for rash.  Allergic/Immunologic: Negative for environmental allergies.  Neurological:  Negative for dizziness, numbness and headaches.  Psychiatric/Behavioral:  The patient is not nervous/anxious.         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)    Cellulitis and abscess of face    Hyperglycemia 11/08/2014   MR (mental retardation)    Other malaise and fatigue    Other screening breast examination    Routine general medical examination at a health care facility    Screening for lipoid disorders    Screening for malignant neoplasm of the cervix    Special screening for malignant neoplasms, colon     Social History   Socioeconomic History   Marital status: Single    Spouse name: Not on file   Number of children: Not on file   Years of  education: Not on file   Highest education level: Not on file  Occupational History   Not on file  Tobacco Use   Smoking status: Never   Smokeless tobacco: Never  Vaping Use   Vaping Use: Never used  Substance and Sexual Activity   Alcohol use: No   Drug use: No   Sexual activity: Not Currently  Other Topics Concern   Not on file  Social History Narrative   Lives in group home   Social Determinants of Health   Financial Resource Strain: Low Risk    Difficulty of Paying Living Expenses: Not hard at all  Food Insecurity: No Food Insecurity   Worried About Programme researcher, broadcasting/film/video in the Last Year: Never true   Ran Out of Food in the Last Year: Never true  Transportation Needs: No Transportation Needs   Lack of Transportation (Medical): No   Lack of Transportation (Non-Medical): No  Physical Activity: Not on file  Stress: No Stress Concern Present   Feeling of Stress : Not at all  Social Connections: Moderately Integrated   Frequency of Communication with Friends and Family: Once a week   Frequency of Social Gatherings with Friends and Family: More than three times a week   Attends Religious Services: More than 4 times per year   Active Member of [employer] or Organizations: Yes   Attends Engineer, structural: More than 4 times per year   Marital Status: Never married  Intimate Partner Violence: Not At Risk  Fear of Current or Ex-Partner: No   Emotionally Abused: No   Physically Abused: No   Sexually Abused: No    History reviewed. No pertinent surgical history.  Family History  Problem Relation Age of Onset   Diabetes Mother    Breast cancer Sister 10    No Known Allergies  Current Outpatient Medications on File Prior to Visit  Medication Sig Dispense Refill   metFORMIN (GLUCOPHAGE) 1000 MG tablet TAKE 1 TABLET BY MOUTH TWICE A DAY FOR DIABETES 180 tablet 1   rosuvastatin (CRESTOR) 5 MG tablet TAKE ONE TABLET BY MOUTH EVERY EVENING FOR CHOLESTEROL 90 tablet 1    No current facility-administered medications on file prior to visit.    BP 136/76   Pulse 69   Temp 98.6 F (37 C) (Temporal)   Ht 5\' 1"  (1.549 m)   Wt 174 lb (78.9 kg)   SpO2 96%   BMI 32.88 kg/m  Objective:   Physical Exam HENT:     Right Ear: Tympanic membrane and ear canal normal.     Left Ear: Tympanic membrane and ear canal normal.     Nose: Nose normal.  Eyes:     Conjunctiva/sclera: Conjunctivae normal.     Pupils: Pupils are equal, round, and reactive to light.  Neck:     Thyroid: No thyromegaly.  Cardiovascular:     Rate and Rhythm: Normal rate and regular rhythm.     Heart sounds: No murmur heard. Pulmonary:     Effort: Pulmonary effort is normal.     Breath sounds: Normal breath sounds. No rales.  Abdominal:     General: Bowel sounds are normal.     Palpations: Abdomen is soft.     Tenderness: There is no abdominal tenderness.  Musculoskeletal:        General: Normal range of motion.     Cervical back: Neck supple.  Lymphadenopathy:     Cervical: No cervical adenopathy.  Skin:    General: Skin is warm and dry.     Findings: Rash present.     Comments: Acute mildly erythematous rash noted to anterior bilateral axilla.  No scaling or skin breakdown.   Neurological:     Mental Status: She is alert and oriented to person, place, and time.     Cranial Nerves: No cranial nerve deficit.     Deep Tendon Reflexes: Reflexes are normal and symmetric.  Psychiatric:        Mood and Affect: Mood normal.          Assessment & Plan:      This visit occurred during the SARS-CoV-2 public health emergency.  Safety protocols were in place, including screening questions prior to the visit, additional usage of staff PPE, and extensive cleaning of exam room while observing appropriate contact time as indicated for disinfecting solutions.

## 2020-12-27 NOTE — Assessment & Plan Note (Addendum)
Compliant to metformin 1000 mg BID, continue same.  Repeat A1C and urine microalbumin due and pending. Continue statin. Eye and foot exams UTD.   Follow up in 6 months.

## 2020-12-27 NOTE — Assessment & Plan Note (Signed)
Compliant to rosuvastatin 5 mg, continue same. Repeat lipid panel pending.  

## 2020-12-27 NOTE — Assessment & Plan Note (Signed)
Appears stable. Patient declines Rx treatment.  Continue to monitor.

## 2021-02-21 ENCOUNTER — Other Ambulatory Visit: Payer: Self-pay | Admitting: Primary Care

## 2021-02-21 DIAGNOSIS — E785 Hyperlipidemia, unspecified: Secondary | ICD-10-CM

## 2021-02-21 DIAGNOSIS — E119 Type 2 diabetes mellitus without complications: Secondary | ICD-10-CM

## 2021-03-27 ENCOUNTER — Other Ambulatory Visit: Payer: Self-pay

## 2021-03-27 ENCOUNTER — Ambulatory Visit
Admission: RE | Admit: 2021-03-27 | Discharge: 2021-03-27 | Disposition: A | Discharge status: Home / Self Care | Payer: Medicare HMO | Source: Ambulatory Visit | Attending: Primary Care | Admitting: Primary Care

## 2021-03-27 DIAGNOSIS — Z780 Asymptomatic menopausal state: Secondary | ICD-10-CM | POA: Diagnosis not present

## 2021-03-27 DIAGNOSIS — Z1231 Encounter for screening mammogram for malignant neoplasm of breast: Secondary | ICD-10-CM | POA: Diagnosis not present

## 2021-06-27 ENCOUNTER — Ambulatory Visit: Payer: Medicare HMO | Admitting: Primary Care

## 2021-07-20 ENCOUNTER — Encounter: Payer: Self-pay | Admitting: Primary Care

## 2021-07-20 ENCOUNTER — Ambulatory Visit (INDEPENDENT_AMBULATORY_CARE_PROVIDER_SITE_OTHER): Payer: Medicare HMO | Admitting: Primary Care

## 2021-07-20 VITALS — BP 134/76 | HR 76 | Temp 98.6°F | Ht 61.0 in | Wt 174.0 lb

## 2021-07-20 DIAGNOSIS — E119 Type 2 diabetes mellitus without complications: Secondary | ICD-10-CM | POA: Diagnosis not present

## 2021-07-20 DIAGNOSIS — E1165 Type 2 diabetes mellitus with hyperglycemia: Secondary | ICD-10-CM | POA: Diagnosis not present

## 2021-07-20 LAB — POCT GLYCOSYLATED HEMOGLOBIN (HGB A1C): Hemoglobin A1C: 6.4 % — AB (ref 4.0–5.6)

## 2021-07-20 MED ORDER — METFORMIN HCL 1000 MG PO TABS: 1000.0000 mg | ORAL_TABLET | Freq: Two times a day (BID) | ORAL | 1 refills | Status: DC

## 2021-07-20 NOTE — Progress Notes (Signed)
Subjective:    Patient ID: Catherine Orr, female    DOB: [birth year], 66 y.o.   MRN: [id number]  HPI  Catherine Orr is a very pleasant 66 y.o. female with a history of type 2 diabetes, hyperlipidemia who presents today for follow up of diabetes.  Current medications include: metformin 1000 mg BID  She is checking her blood glucose 0 times daily.  Last A1C: 6.3 in November 2022, 6.4 today Last Eye Exam: UTD Last Foot Exam: Due Pneumonia Vaccination: 2021 Urine Microalbumin:  Statin: rosuvastatin   Dietary changes since last visit: None.    Exercise: Walking    BP Readings from Last 3 Encounters:  07/20/21 134/76  12/27/20 136/76  06/14/20 138/84      Review of Systems  Respiratory:  Negative for shortness of breath.   Cardiovascular:  Negative for chest pain.  Gastrointestinal:  Negative for abdominal pain and diarrhea.  Neurological:  Negative for dizziness and headaches.        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)    Cellulitis and abscess of face    Hyperglycemia 11/08/2014   MR (mental retardation)    Other malaise and fatigue    Other screening breast examination    Routine general medical examination at a health care facility    Screening for lipoid disorders    Screening for malignant neoplasm of the cervix    Special screening for malignant neoplasms, colon     Social History   Socioeconomic History   Marital status: Single    Spouse name: Not on file   Number of children: Not on file   Years of education: Not on file   Highest education level: Not on file  Occupational History   Not on file  Tobacco Use   Smoking status: Never   Smokeless tobacco: Never  Vaping Use   Vaping Use: Never used  Substance and Sexual Activity   Alcohol use: No   Drug use: No   Sexual activity: Not Currently  Other Topics Concern   Not on file  Social History Narrative   Lives in group home   Social  Determinants of Health   Financial Resource Strain: Low Risk    Difficulty of Paying Living Expenses: Not hard at all  Food Insecurity: No Food Insecurity   Worried About Programme researcher, broadcasting/film/video in the Last Year: Never true   Ran Out of Food in the Last Year: Never true  Transportation Needs: No Transportation Needs   Lack of Transportation (Medical): No   Lack of Transportation (Non-Medical): No  Physical Activity: Not on file  Stress: No Stress Concern Present   Feeling of Stress : Not at all  Social Connections: Moderately Integrated   Frequency of Communication with Friends and Family: Once a week   Frequency of Social Gatherings with Friends and Family: More than three times a week   Attends Religious Services: More than 4 times per year   Active Member of [employer] or Organizations: Yes   Attends Engineer, structural: More than 4 times per year   Marital Status: Never married  Catering manager Violence: Not At Risk   Fear of Current or Ex-Partner: No   Emotionally Abused: No   Physically Abused: No   Sexually Abused: No    History reviewed. No pertinent surgical history.  Family History  Problem Relation Age of Onset   Diabetes Mother    Breast cancer  Sister 65   Breast cancer Maternal Aunt     No Known Allergies  Current Outpatient Medications on File Prior to Visit  Medication Sig Dispense Refill   metFORMIN (GLUCOPHAGE) 1000 MG tablet TAKE 1 TABLET BY MOUTH TWICE A DAY FOR DIABETES 180 tablet 1   rosuvastatin (CRESTOR) 5 MG tablet TAKE ONE TABLET BY MOUTH EVERY EVENING FOR CHOLESTEROL 90 tablet 3   No current facility-administered medications on file prior to visit.    BP 134/76   Pulse 76   Temp 98.6 F (37 C) (Oral)   Ht 5\' 1"  (1.549 m)   Wt 174 lb (78.9 kg)   SpO2 97%   BMI 32.88 kg/m  Objective:   Physical Exam Cardiovascular:     Rate and Rhythm: Normal rate and regular rhythm.  Pulmonary:     Effort: Pulmonary effort is normal.     Breath  sounds: Normal breath sounds.  Musculoskeletal:     Cervical back: Neck supple.  Skin:    General: Skin is warm and dry.          Assessment & Plan:

## 2021-07-20 NOTE — Assessment & Plan Note (Signed)
Controlled with A1C of 6.4 today.  Commended her on daily walking!  Continue metformin 1000 mg BID.   Managed on statin.  Foot exam today. Urine microalbumin UTD. Pneumonia vaccine UTD.  Follow up in 6 months.

## 2021-07-20 NOTE — Patient Instructions (Signed)
It was a pleasure to see you today!   

## 2021-12-21 ENCOUNTER — Ambulatory Visit (INDEPENDENT_AMBULATORY_CARE_PROVIDER_SITE_OTHER): Payer: Medicare HMO

## 2021-12-21 VITALS — Ht 61.0 in | Wt 174.0 lb

## 2021-12-21 DIAGNOSIS — Z0000 Encounter for general adult medical examination without abnormal findings: Secondary | ICD-10-CM

## 2021-12-21 NOTE — Patient Instructions (Addendum)
Catherine Orr , Thank you for taking time to come for your Medicare Wellness Visit. I appreciate your ongoing commitment to your health goals. Please review the following plan we discussed and let me know if I can assist you in the future.   These are the goals we discussed:  Goals      Increase physical activity     Starting 11/07/2015, I will attempt to drink at least 6-8 glasses of water daily.      Patient Stated     Starting 08/13/2017, I will continue to take medications as prescribed.       Patient Stated     12/16/2019, I will continue to walk everyday for about 20-30 minutes.      Patient Stated     Maintain current goal drinking more water.        This is a list of the screening recommended for you and due dates:  Health Maintenance  Topic Date Due   Yearly kidney function blood test for diabetes  12/27/2021   Yearly kidney health urinalysis for diabetes  12/27/2021   COVID-19 Vaccine (1) 01/06/2022*   Eye exam for diabetics  01/18/2022*   Pneumonia Vaccine (2 - PCV) 01/19/2022*   Flu Shot  05/20/2022*   Tetanus Vaccine  12/22/2022*   Hemoglobin A1C  01/19/2022   Complete foot exam   07/21/2022   Medicare Annual Wellness Visit  12/22/2022   Cologuard (Stool DNA test)  01/01/2023   Mammogram  03/28/2023   DEXA scan (bone density measurement)  Completed   Hepatitis C Screening: USPSTF Recommendation to screen - Ages 4-79 yo.  Completed   Zoster (Shingles) Vaccine  Completed   HPV Vaccine  Aged Out   Colon Cancer Screening  Discontinued  *Topic was postponed. The date shown is not the original due date.    Advanced directives: End of life planning; Advance aging; Advanced directives discussed.  Copy of current HCPOA/Living Will requested.    Conditions/risks identified: none new  Next appointment: Follow up in one year for your annual wellness visit    Preventive Care 65 Years and Older, Female Preventive care refers to lifestyle choices and visits with your  health care provider that can promote health and wellness. What does preventive care include? A yearly physical exam. This is also called an annual well check. Dental exams once or twice a year. Routine eye exams. Ask your health care provider how often you should have your eyes checked. Personal lifestyle choices, including: Daily care of your teeth and gums. Regular physical activity. Eating a healthy diet. Avoiding tobacco and drug use. Limiting alcohol use. Practicing safe sex. Taking low-dose aspirin every day. Taking vitamin and mineral supplements as recommended by your health care provider. What happens during an annual well check? The services and screenings done by your health care provider during your annual well check will depend on your age, overall health, lifestyle risk factors, and family history of disease. Counseling  Your health care provider may ask you questions about your: Alcohol use. Tobacco use. Drug use. Emotional well-being. Home and relationship well-being. Sexual activity. Eating habits. History of falls. Memory and ability to understand (cognition). Work and work Astronomer. Reproductive health. Screening  You may have the following tests or measurements: Height, weight, and BMI. Blood pressure. Lipid and cholesterol levels. These may be checked every 5 years, or more frequently if you are over 51 years old. Skin check. Lung cancer screening. You may have this screening every  year starting at age 71 if you have a 30-pack-year history of smoking and currently smoke or have quit within the past 15 years. Fecal occult blood test (FOBT) of the stool. You may have this test every year starting at age 80. Flexible sigmoidoscopy or colonoscopy. You may have a sigmoidoscopy every 5 years or a colonoscopy every 10 years starting at age 52. Hepatitis C blood test. Hepatitis B blood test. Sexually transmitted disease (STD) testing. Diabetes screening. This  is done by checking your blood sugar (glucose) after you have not eaten for a while (fasting). You may have this done every 1-3 years. Bone density scan. This is done to screen for osteoporosis. You may have this done starting at age 33. Mammogram. This may be done every 1-2 years. Talk to your health care provider about how often you should have regular mammograms. Talk with your health care provider about your test results, treatment options, and if necessary, the need for more tests. Vaccines  Your health care provider may recommend certain vaccines, such as: Influenza vaccine. This is recommended every year. Tetanus, diphtheria, and acellular pertussis (Tdap, Td) vaccine. You may need a Td booster every 10 years. Zoster vaccine. You may need this after age 54. Pneumococcal 13-valent conjugate (PCV13) vaccine. One dose is recommended after age 4. Pneumococcal polysaccharide (PPSV23) vaccine. One dose is recommended after age 44. Talk to your health care provider about which screenings and vaccines you need and how often you need them. This information is not intended to replace advice given to you by your health care provider. Make sure you discuss any questions you have with your health care provider. Document Released: 03/04/2015 Document Revised: 10/26/2015 Document Reviewed: 12/07/2014 Elsevier Interactive Patient Education  2017 [employer].  Fall Prevention in the Home Falls can cause injuries. They can happen to people of all ages. There are many things you can do to make your home safe and to help prevent falls. What can I do on the outside of my home? Regularly fix the edges of walkways and driveways and fix any cracks. Remove anything that might make you trip as you walk through a door, such as a raised step or threshold. Trim any bushes or trees on the path to your home. Use bright outdoor lighting. Clear any walking paths of anything that might make someone trip, such as  rocks or tools. Regularly check to see if handrails are loose or broken. Make sure that both sides of any steps have handrails. Any raised decks and porches should have guardrails on the edges. Have any leaves, snow, or ice cleared regularly. Use sand or salt on walking paths during winter. Clean up any spills in your garage right away. This includes oil or grease spills. What can I do in the bathroom? Use night lights. Install grab bars by the toilet and in the tub and shower. Do not use towel bars as grab bars. Use non-skid mats or decals in the tub or shower. If you need to sit down in the shower, use a plastic, non-slip stool. Keep the floor dry. Clean up any water that spills on the floor as soon as it happens. Remove soap buildup in the tub or shower regularly. Attach bath mats securely with double-sided non-slip rug tape. Do not have throw rugs and other things on the floor that can make you trip. What can I do in the bedroom? Use night lights. Make sure that you have a light by your bed that  is easy to reach. Do not use any sheets or blankets that are too big for your bed. They should not hang down onto the floor. Have a firm chair that has side arms. You can use this for support while you get dressed. Do not have throw rugs and other things on the floor that can make you trip. What can I do in the kitchen? Clean up any spills right away. Avoid walking on wet floors. Keep items that you use a lot in easy-to-reach places. If you need to reach something above you, use a strong step stool that has a grab bar. Keep electrical cords out of the way. Do not use floor polish or wax that makes floors slippery. If you must use wax, use non-skid floor wax. Do not have throw rugs and other things on the floor that can make you trip. What can I do with my stairs? Do not leave any items on the stairs. Make sure that there are handrails on both sides of the stairs and use them. Fix handrails  that are broken or loose. Make sure that handrails are as long as the stairways. Check any carpeting to make sure that it is firmly attached to the stairs. Fix any carpet that is loose or worn. Avoid having throw rugs at the top or bottom of the stairs. If you do have throw rugs, attach them to the floor with carpet tape. Make sure that you have a light switch at the top of the stairs and the bottom of the stairs. If you do not have them, ask someone to add them for you. What else can I do to help prevent falls? Wear shoes that: Do not have high heels. Have rubber bottoms. Are comfortable and fit you well. Are closed at the toe. Do not wear sandals. If you use a stepladder: Make sure that it is fully opened. Do not climb a closed stepladder. Make sure that both sides of the stepladder are locked into place. Ask someone to hold it for you, if possible. Clearly mark and make sure that you can see: Any grab bars or handrails. First and last steps. Where the edge of each step is. Use tools that help you move around (mobility aids) if they are needed. These include: Canes. Walkers. Scooters. Crutches. Turn on the lights when you go into a dark area. Replace any light bulbs as soon as they burn out. Set up your furniture so you have a clear path. Avoid moving your furniture around. If any of your floors are uneven, fix them. If there are any pets around you, be aware of where they are. Review your medicines with your doctor. Some medicines can make you feel dizzy. This can increase your chance of falling. Ask your doctor what other things that you can do to help prevent falls. This information is not intended to replace advice given to you by your health care provider. Make sure you discuss any questions you have with your health care provider. Document Released: 12/02/2008 Document Revised: 07/14/2015 Document Reviewed: 03/12/2014 Elsevier Interactive Patient Education  2017 [employer].

## 2021-12-21 NOTE — Progress Notes (Signed)
Subjective:   Catherine Orr is a 67 y.o. female who presents for Medicare Annual (Subsequent) preventive examination.  Review of Systems    No ROS.  Medicare Wellness Virtual Visit.  Visual/audio telehealth visit, UTA vital signs.   See social history for additional risk factors.   Cardiac Risk Factors include: advanced age (>32men, >69 women);diabetes mellitus     Objective:    Today's Vitals   12/21/21 0924  Weight: 174 lb (78.9 kg)  Height: 5\' 1"  (1.549 m)   Body mass index is 32.88 kg/m.     12/21/2021    9:18 AM 12/20/2020    8:19 AM 12/16/2019    1:16 PM 08/13/2017    8:22 AM 11/07/2015    8:55 AM  Advanced Directives  Does Patient Have a Medical Advance Directive? Yes Yes No Yes Yes  Type of 11/09/2015 of Estate agent Power of [place];Living will  Healthcare Power of [place];Living will Healthcare Power of Attorney  Does patient want to make changes to medical advance directive?  Yes (MAU/Ambulatory/Procedural Areas - Information given)   No - Patient declined  Copy of Healthcare Power of Attorney in Chart? No - copy requested No - copy requested  No - copy requested No - copy requested  Would patient like information on creating a medical advance directive?   No - Patient declined     Current Medications (verified) Outpatient Encounter Medications as of 12/21/2021  Medication Sig   metFORMIN (GLUCOPHAGE) 1000 MG tablet Take 1 tablet (1,000 mg total) by mouth 2 (two) times daily with a meal. for diabetes.   rosuvastatin (CRESTOR) 5 MG tablet TAKE ONE TABLET BY MOUTH EVERY EVENING FOR CHOLESTEROL   No facility-administered encounter medications on file as of 12/21/2021.    Allergies (verified) Patient has no known allergies.   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 No pertinent surgical history. Family History  Problem Relation Age of Onset   Diabetes Mother    Breast cancer Sister 75   Breast cancer Maternal Aunt    Social History   Socioeconomic History   Marital status: Single    Spouse name: Not on file   Number of children: Not on file   Years of education: Not on file   Highest education level: Not on file  Occupational History   Not on file  Tobacco Use   Smoking status: Never   Smokeless tobacco: Never  Vaping Use   Vaping Use: Never used  Substance and Sexual Activity   Alcohol use: No   Drug use: No   Sexual activity: Not Currently  Other Topics Concern   Not on file  Social History Narrative   Lives in group home   Social Determinants of Health   Financial Resource Strain: Low Risk  (12/21/2021)   Overall Financial Resource Strain (CARDIA)    Difficulty of Paying Living Expenses: Not hard at all  Food Insecurity: No Food Insecurity (12/21/2021)   Hunger Vital Sign    Worried  About Running Out of Food in the Last Year: Never true    Ran Out of Food in the Last Year: Never true  Transportation Needs: No Transportation Needs (12/21/2021)   PRAPARE - Hydrologist (Medical): No    Lack of Transportation (Non-Medical): No  Physical Activity: Sufficiently Active (12/21/2021)   Exercise Vital Sign    Days of Exercise per Week: 7 days    Minutes of Exercise per Session: 30 min  Stress: No Stress Concern Present (12/21/2021)   [place]    Feeling of Stress : Not at all  Social Connections: Moderately Integrated (12/21/2021)   Social Connection and Isolation Panel  [NHANES]    Frequency of Communication with Friends and Family: Once a week    Frequency of Social Gatherings with Friends and Family: More than three times a week    Attends Religious Services: More than 4 times per year    Active Member of [employer] or Organizations: Yes    Attends Music therapist: More than 4 times per year    Marital Status: Never married    Tobacco Counseling Counseling given: Not Answered   Clinical Intake:  Pre-visit preparation completed: Yes        Diabetes: Yes  How often do you need to have someone help you when you read instructions, pamphlets, or other written materials from your doctor or pharmacy?: 1 - Never  Nutrition Risk Assessment: Has the patient had any N/V/D within the last 2 months?  No  Does the patient have any non-healing wounds?  No  Has the patient had any unintentional weight loss or weight gain?  No   Financial Strains and Diabetes Management: Are you having any financial strains with the device, your supplies or your medication? No .  Does the patient want to be seen by Chronic Care Management for management of their diabetes?  No  Would the patient like to be referred to a Nutritionist or for Diabetic Management?  No    Interpreter Needed?: No      Activities of Daily Living    12/21/2021    9:20 AM  In your present state of health, do you have any difficulty performing the following activities:  Hearing? 0  Vision? 0  Difficulty concentrating or making decisions? 0  Walking or climbing stairs? 0  Dressing or bathing? 0  Doing errands, shopping? 1  Comment [place] and eating ? N  Using the Toilet? N  In the past six months, have you accidently leaked urine? N  Do you have problems with loss of bowel control? N  Managing your Medications? N  Managing your Finances? Y  Comment Family assist  Housekeeping or managing your Housekeeping? N    Patient Care Team: Pleas Koch, NP as PCP - General (Internal Medicine) Lafayette Dragon, MD (Inactive) (Gastroenterology)  Indicate any recent Medical Services you may have received from other than [facility] providers in the past year (date may be approximate).     Assessment:   This is a routine wellness examination for [facility].  I connected with  ROSCHELLE Orr on 12/21/21 by a audio enabled telemedicine application and verified that I am speaking with the correct person using two identifiers.  Patient Location: Home  Provider Location: Office/Clinic  I discussed the limitations of evaluation and management by telemedicine. The patient expressed understanding and agreed to proceed.  Hearing/Vision screen Hearing Screening - Comments:: Patient is able to hear conversational tones without difficulty.  No issues reported.   Vision Screening - Comments:: Last exam 2022, Dr Lavona Mound Wears reading glasses Next appointment scheduled 12/2021  Dietary issues and exercise activities discussed: Current Exercise Habits: Home exercise routine, Type of exercise: calisthenics, Time (Minutes): 30, Frequency (Times/Week): 5, Weekly Exercise (Minutes/Week): 150, Intensity: Mild Healthy diet Good water intake    Goals Addressed             This Visit's Progress    Patient Stated   On track    Maintain current goal drinking more water.       Depression Screen    12/21/2021    9:35 AM 12/20/2020    8:23 AM 12/16/2019    1:17 PM 12/15/2019    8:46 AM 08/13/2017    8:17 AM 11/07/2015    8:28 AM 11/08/2014   12:02 PM  PHQ 2/9 Scores  PHQ - 2 Score 0 0 0 0 0 0 0  PHQ- 9 Score   0 0 0      Fall Risk    12/21/2021    9:37 AM 12/20/2020    8:22 AM 12/16/2019    1:17 PM 12/15/2019    8:45 AM 08/13/2017    8:17 AM  Fall Risk   Falls in the past year? 0 0 0 0 No  Number falls in past yr: 0 0 0 0   Injury with Fall? 0 0 0 0   Risk for fall due to : No Fall Risks No Fall Risks No Fall Risks    Follow up Falls evaluation  completed;Falls prevention discussed Falls prevention discussed Falls evaluation completed;Falls prevention discussed      FALL RISK PREVENTION PERTAINING TO THE HOME: Home free of loose throw rugs in walkways, pet beds, electrical cords, etc? Yes  Adequate lighting in your home to reduce risk of falls? Yes   ASSISTIVE DEVICES UTILIZED TO PREVENT FALLS: Life alert? No  Use of a cane, walker or w/c? No  Grab bars in the bathroom? Yes  Shower chair or bench in shower? No  Elevated toilet seat or a handicapped toilet? No   TIMED UP AND GO: Was the test performed? No .   Cognitive Function: Patient is alert and oriented x3.  Recall 100%     12/16/2019    1:19 PM 08/13/2017    8:20 AM 11/07/2015    8:55 AM  MMSE - Mini Mental State Exam  Orientation to time 5 5 5   Orientation to Place 5 5 5   Registration 3 3 3   Attention/ Calculation 0 0 0  Recall 3 3 2   Recall-comments   pt was unable to recall 1 of 3 words  Language- name 2 objects  0 0  Language- repeat 1 1 1   Language- follow 3 step command  3 3  Language- read & follow direction  0 0  Write a sentence  0 0  Copy design  0 0  Total score  20 19        12/21/2021    9:43 AM  6CIT Screen  What Year? 0 points  What month? 0 points  What time? 0 points  Months in reverse 0 points    Immunizations Immunization History  Administered Date(s) Administered   Influenza,inj,Quad PF,6+ Mos 11/08/2014, 11/07/2015, 11/24/2016, 12/05/2017   Influenza-Unspecified 11/16/2013   Pneumococcal Polysaccharide-23 02/13/2017, 12/15/2019   Td 06/08/2009   Zoster Recombinat (  Shingrix) 09/06/2018, 12/27/2018    TDAP status: Due, Education has been provided regarding the importance of this vaccine. Advised may receive this vaccine at local pharmacy or Health Dept. Aware to provide a copy of the vaccination record if obtained from local pharmacy or Health Dept. Verbalized acceptance and understanding.  Flu Vaccine status: Due,  Education has been provided regarding the importance of this vaccine. Advised may receive this vaccine at local pharmacy or Health Dept. Aware to provide a copy of the vaccination record if obtained from local pharmacy or Health Dept. Verbalized acceptance and understanding.  Pneumococcal vaccine status: Due, Education has been provided regarding the importance of this vaccine. Advised may receive this vaccine at local pharmacy or Health Dept. Aware to provide a copy of the vaccination record if obtained from local pharmacy or Health Dept. Verbalized acceptance and understanding.  Covid-19 vaccine status: Declined, Education has been provided regarding the importance of this vaccine but patient still declined. Advised may receive this vaccine at local pharmacy or Health Dept.or vaccine clinic. Aware to provide a copy of the vaccination record if obtained from local pharmacy or Health Dept. Verbalized acceptance and understanding.  Screening Tests Health Maintenance  Topic Date Due   Diabetic kidney evaluation - GFR measurement  12/27/2021   Diabetic kidney evaluation - Urine ACR  12/27/2021   COVID-19 Vaccine (1) 01/06/2022 (Originally 06/29/1956)   OPHTHALMOLOGY EXAM  01/18/2022 (Originally 10/18/2021)   Pneumonia Vaccine 87+ Years old (2 - PCV) 01/19/2022 (Originally 12/14/2020)   INFLUENZA VACCINE  05/20/2022 (Originally 09/19/2021)   TETANUS/TDAP  12/22/2022 (Originally 06/09/2019)   HEMOGLOBIN A1C  01/19/2022   FOOT EXAM  07/21/2022   Medicare Annual Wellness (AWV)  12/22/2022   Fecal DNA (Cologuard)  01/01/2023   MAMMOGRAM  03/28/2023   DEXA SCAN  Completed   Hepatitis C Screening  Completed   Zoster Vaccines- Shingrix  Completed   HPV VACCINES  Aged Out   COLONOSCOPY (Pts 45-49yrs Insurance coverage will need to be confirmed)  Discontinued   Health Maintenance Health Maintenance Due  Topic Date Due   Diabetic kidney evaluation - GFR measurement  12/27/2021   Diabetic kidney  evaluation - Urine ACR  12/27/2021   Lung Cancer Screening: (Low Dose CT Chest recommended if Age 15-80 years, 30 pack-year currently smoking OR have quit w/in 15years.) does not qualify.   Hepatitis C Screening: Completed 2016.  Vision Screening: Recommended annual ophthalmology exams for early detection of glaucoma and other disorders of the eye.  Dental Screening: Recommended annual dental exams for proper oral hygiene.  Community Resource Referral / Chronic Care Management: CRR required this visit?  No   CCM required this visit?  No      Plan:    Cpe scheduled 01/03/22.   I have personally reviewed and noted the following in the patient's chart:   Medical and social history Use of alcohol, tobacco or illicit drugs  Current medications and supplements including opioid prescriptions. Patient is not currently taking opioid prescriptions. Functional ability and status Nutritional status Physical activity Advanced directives List of other physicians Hospitalizations, surgeries, and ER visits in previous 12 months Vitals Screenings to include cognitive, depression, and falls Referrals and appointments  In addition, I have reviewed and discussed with patient certain preventive protocols, quality metrics, and best practice recommendations. A written personalized care plan for preventive services as well as general preventive health recommendations were provided to patient.     Cathey Endow, LPN   [id number]

## 2022-01-03 ENCOUNTER — Encounter: Payer: Medicare HMO | Admitting: Primary Care

## 2022-02-20 ENCOUNTER — Other Ambulatory Visit: Payer: Self-pay | Admitting: Primary Care

## 2022-02-20 DIAGNOSIS — E1165 Type 2 diabetes mellitus with hyperglycemia: Secondary | ICD-10-CM

## 2022-02-20 DIAGNOSIS — E785 Hyperlipidemia, unspecified: Secondary | ICD-10-CM

## 2022-03-01 ENCOUNTER — Encounter: Payer: Medicare HMO | Admitting: Primary Care

## 2022-03-08 ENCOUNTER — Encounter: Payer: Medicare HMO | Admitting: Primary Care

## 2022-04-02 ENCOUNTER — Telehealth: Payer: Self-pay | Admitting: Primary Care

## 2022-04-02 DIAGNOSIS — H2513 Age-related nuclear cataract, bilateral: Secondary | ICD-10-CM | POA: Diagnosis not present

## 2022-04-02 DIAGNOSIS — E119 Type 2 diabetes mellitus without complications: Secondary | ICD-10-CM | POA: Diagnosis not present

## 2022-04-02 DIAGNOSIS — H11153 Pinguecula, bilateral: Secondary | ICD-10-CM | POA: Diagnosis not present

## 2022-04-02 LAB — HM DIABETES EYE EXAM

## 2022-04-02 NOTE — Telephone Encounter (Signed)
Per appt notes pt already has appt scheduled with Gentry Fitz NP on 04/04/22 at 8 AM. Sending note to Gentry Fitz NP who is out of office and clark Pool and Matt cable NP who is in office.

## 2022-04-02 NOTE — Telephone Encounter (Signed)
[name] - Client TELEPHONE ADVICE RECORD [facility] Patient Name: Catherine Orr [facility] Gender: Female DOB: Dec 07, 1955 Age: 67 Y 3 M 2 D Return Phone Number: [phone] (Primary) Address: City/ State/ Zip: [place]  [address] Client [name] - Client Client Site [place] - Day Provider Alma Friendly - NP Contact Type Call Who Is Calling Patient / Member / Family / Caregiver Call Type Triage / Clinical Caller Name Claiborne Billings Relationship To Patient Other Return Phone Number [phone] (Primary) Chief Complaint NUMBNESS/TINGLING- sudden on one side of the body or face Reason for Call Symptomatic / Request for [place] states the pt. is having some neck pain and some twitching/tingeling on their left leg. It also feels like water is dripping on her skin. Translation No Nurse Assessment Nurse: Curlene Labrum, RN, [place] Date/Time (Eastern Time): 04/02/2022 1:20:27 PM Confirm and document reason for call. If symptomatic, describe symptoms. ---Caller states neck pain started a few days ago. When she she bends over she feels tingling in both legs and feet. Pain in neck hurts when laying down and bending over. Rates her pain as an 8/10. Denies recent injury or fall. Does the patient have any new or worsening symptoms? ---Yes Will a triage be completed? ---Yes Related visit to physician within the last 2 weeks? ---No Does the PT have any chronic conditions? (i.e. diabetes, asthma, this includes High risk factors for pregnancy, etc.) ---Yes List chronic conditions. ---DM, HLD Is this a behavioral health or substance abuse call? ---No Guidelines Guideline Title Affirmed Question Affirmed Notes Nurse Date/Time Eilene Ghazi Time) Neck Pain or Stiffness [1] MODERATE neck pain (e.g., interferes with normal activities) AND [2] present > 3 days Belac, RN, [place]  04/02/2022 1:22:40 PM PLEASE NOTE: All timestamps contained within this report are represented as [place] Standard Time. CONFIDENTIALTY NOTICE: This fax transmission is intended only for the addressee. It contains information that is legally privileged, confidential or otherwise protected from use or disclosure. If you are not the intended recipient, you are strictly prohibited from reviewing, disclosing, copying using or disseminating any of this information or taking any action in reliance on or regarding this information. If you have received this fax in error, please notify us immediately by telephone so that we can arrange for its return to [place]. Phone: [phone], Toll-Free: [phone], Fax: [phone] Page: 2 of 2 Call Id: [id number] [place]. Time Eilene Ghazi Time) Disposition Final User 04/02/2022 1:14:52 PM Send to Urgent Harrie Jeans 04/02/2022 1:30:27 PM SEE PCP WITHIN 3 DAYS Yes Belac, RN, [place] Final Disposition 04/02/2022 1:30:27 PM SEE PCP WITHIN 3 DAYS Yes Belac, RN, [place] Caller Disagree/Comply Comply Caller Understands Yes PreDisposition Call Doctor Care Advice Given Per Guideline SEE PCP WITHIN 3 DAYS: * You need to be seen within 2 or 3 days. * For pain relief, you can take either acetaminophen, ibuprofen, or naproxen. ACTIVITY: * Limit neck movement for 48 hours. * After 48 hours begin gentle stretching exercises. AVOID: * Avoid activity that puts stress on the neck. CALL BACK IF: * You become worse CARE ADVICE given per Neck Pain (Adult) guideline. Referrals REFERRED TO PCP OFFIC

## 2022-04-02 NOTE — Telephone Encounter (Signed)
Patient called in stating she is having neck pian,twitching down her left leg, and it feels like water is dropping on her skin. I sent her to speak to a nurse at access.

## 2022-04-02 NOTE — Telephone Encounter (Signed)
Noted  

## 2022-04-04 ENCOUNTER — Ambulatory Visit (INDEPENDENT_AMBULATORY_CARE_PROVIDER_SITE_OTHER): Payer: Medicare HMO | Admitting: Primary Care

## 2022-04-04 ENCOUNTER — Encounter: Payer: Self-pay | Admitting: Primary Care

## 2022-04-04 VITALS — BP 148/96 | HR 100 | Temp 97.3°F | Ht 61.0 in | Wt 180.0 lb

## 2022-04-04 DIAGNOSIS — M25519 Pain in unspecified shoulder: Secondary | ICD-10-CM | POA: Insufficient documentation

## 2022-04-04 DIAGNOSIS — M25512 Pain in left shoulder: Secondary | ICD-10-CM | POA: Diagnosis not present

## 2022-04-04 NOTE — Assessment & Plan Note (Addendum)
Could be arthritis with mild nerve involvement.  No alarm signs on exam.  Start ibuprofen 400 mg BID x 1 week. Discussed use of heating pad and Bengay.  Discussed to get up from her laptop at least once every hour to stretch and walk.  Follow up PRN.  Consider holding rosuvastatin if symptoms persist.

## 2022-04-04 NOTE — Patient Instructions (Signed)
Start ibuprofen 400 mg (2 pills) twice daily for one week.  Get up at least once every hour to walk or stretch.   You can use Bengay or a heating pad to the arm.   We will see you next month for your physical.   It was a pleasure to see you today!

## 2022-04-04 NOTE — Progress Notes (Signed)
Subjective:    Patient ID: Catherine Orr, female    DOB: 04-04-1955, 67 y.o.   MRN: [id number]  Neck Pain  Associated symptoms include numbness. Pertinent negatives include no weakness.    Catherine Orr is a very pleasant 67 y.o. female with a history of type 2 diabetes, psoriasis, hyperlipidemia who presents today to discuss neck pain.   Her caregiver is with her today who is helping with HPI.  Symptom onset one week ago with left shoulder pain. Her pain will radiate down to her left mid humeral region.  Her pain is worse at night. She's noticed intermittent tingling to the left upper extremity.   She denies decrease in ROM, neck pain, right shoulder pain, back pain, injury/trauma, weakness. She sits her her computer for most of her day, hardly gets up to walk or stretch.   She's tried taking Tylenol and using a heating pad which helps with her pain.   Review of Systems  Musculoskeletal:  Negative for back pain and neck pain.       Left shoulder pain  Skin:  Negative for color change.  Neurological:  Positive for numbness. Negative for weakness.         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)    Cellulitis and abscess of face    Hyperglycemia 11/08/2014   MR (mental retardation)    Other malaise and fatigue    Other screening breast examination    Routine general medical examination at a health care facility    Screening for lipoid disorders    Screening for malignant neoplasm of the cervix    Special screening for malignant neoplasms, colon     Social History   Socioeconomic History   Marital status: Single    Spouse name: Not on file   Number of children: Not on file   Years of education: Not on file   Highest education level: Not on file  Occupational History   Not on file  Tobacco Use   Smoking status: Never   Smokeless tobacco: Never  Vaping Use   Vaping Use: Never used  Substance and Sexual Activity    Alcohol use: No   Drug use: No   Sexual activity: Not Currently  Other Topics Concern   Not on file  Social History Narrative   Lives in group home   Social Determinants of Health   Financial Resource Strain: Low Risk  (12/21/2021)   Overall Financial Resource Strain (CARDIA)    Difficulty of Paying Living Expenses: Not hard at all  Food Insecurity: No Food Insecurity (12/21/2021)   Hunger Vital Sign    Worried About Running Out of Food in the Last Year: Never true    Ran Out of Food in the Last Year: Never true  Transportation Needs: No Transportation Needs (12/21/2021)   PRAPARE - Hydrologist (Medical): No    Lack of Transportation (Non-Medical): No  Physical Activity: Sufficiently Active (12/21/2021)   Exercise Vital Sign    Days of Exercise per Week: 7 days    Minutes of Exercise per Session: 30 min  Stress: No Stress Concern Present (12/21/2021)   [place]    Feeling of Stress : Not at all  Social Connections: Moderately Integrated (12/21/2021)   Social Connection and Isolation Panel [NHANES]    Frequency of Communication with Friends and Family: Once a week  Frequency of Social Gatherings with Friends and Family: More than three times a week    Attends Religious Services: More than 4 times per year    Active Member of [employer] or Organizations: Yes    Attends Archivist Meetings: More than 4 times per year    Marital Status: Never married  Intimate Partner Violence: Not At Risk (12/21/2021)   Humiliation, Afraid, Rape, and Kick questionnaire    Fear of Current or Ex-Partner: No    Emotionally Abused: No    Physically Abused: No    Sexually Abused: No    History reviewed. No pertinent surgical history.  Family History  Problem Relation Age of Onset   Diabetes Mother    Breast cancer Sister 33   Breast cancer Maternal Aunt     No Known Allergies  Current  Outpatient Medications on File Prior to Visit  Medication Sig Dispense Refill   metFORMIN (GLUCOPHAGE) 1000 MG tablet TAKE 1 TABLET BY MOUTH TWICE A DAY WITH MEAL FOR DIABETES 180 tablet 0   rosuvastatin (CRESTOR) 5 MG tablet TAKE ONE TABLET BY MOUTH EVERY EVENING FOR CHOLESTEROL 90 tablet 0   No current facility-administered medications on file prior to visit.    BP (!) 148/96   Pulse 100   Temp (!) 97.3 F (36.3 C) (Temporal)   Ht 5' 1"$  (1.549 m)   Wt 180 lb (81.6 kg)   SpO2 98%   BMI 34.01 kg/m  Objective:   Physical Exam Constitutional:      General: She is not in acute distress. Pulmonary:     Effort: Pulmonary effort is normal.  Musculoskeletal:     Right shoulder: Normal range of motion. Normal strength.     Left shoulder: Normal range of motion. Normal strength.     Comments: Negative empty can test.            Assessment & Plan:  Acute pain of left shoulder Assessment & Plan: Could be arthritis with mild nerve involvement.  No alarm signs on exam.  Start ibuprofen 400 mg BID x 1 week. Discussed use of heating pad and Bengay.  Discussed to get up from her laptop at least once every hour to stretch and walk.  Follow up PRN.  Consider holding rosuvastatin if symptoms persist.           Pleas Koch, NP

## 2022-05-16 ENCOUNTER — Other Ambulatory Visit: Payer: Self-pay | Admitting: Primary Care

## 2022-05-16 DIAGNOSIS — E1165 Type 2 diabetes mellitus with hyperglycemia: Secondary | ICD-10-CM

## 2022-05-16 DIAGNOSIS — E785 Hyperlipidemia, unspecified: Secondary | ICD-10-CM

## 2022-05-17 ENCOUNTER — Encounter: Payer: Self-pay | Admitting: Primary Care

## 2022-05-17 ENCOUNTER — Ambulatory Visit (INDEPENDENT_AMBULATORY_CARE_PROVIDER_SITE_OTHER): Payer: Medicare HMO | Admitting: Primary Care

## 2022-05-17 VITALS — BP 134/84 | HR 72 | Temp 97.2°F | Ht 61.0 in | Wt 178.0 lb

## 2022-05-17 DIAGNOSIS — Z0000 Encounter for general adult medical examination without abnormal findings: Secondary | ICD-10-CM

## 2022-05-17 DIAGNOSIS — L409 Psoriasis, unspecified: Secondary | ICD-10-CM

## 2022-05-17 DIAGNOSIS — M25512 Pain in left shoulder: Secondary | ICD-10-CM | POA: Diagnosis not present

## 2022-05-17 DIAGNOSIS — E785 Hyperlipidemia, unspecified: Secondary | ICD-10-CM | POA: Diagnosis not present

## 2022-05-17 DIAGNOSIS — E1165 Type 2 diabetes mellitus with hyperglycemia: Secondary | ICD-10-CM | POA: Diagnosis not present

## 2022-05-17 DIAGNOSIS — R030 Elevated blood-pressure reading, without diagnosis of hypertension: Secondary | ICD-10-CM

## 2022-05-17 LAB — COMPREHENSIVE METABOLIC PANEL
ALT: 22 U/L (ref 0–35)
AST: 16 U/L (ref 0–37)
Albumin: 4.5 g/dL (ref 3.5–5.2)
Alkaline Phosphatase: 65 U/L (ref 39–117)
BUN: 18 mg/dL (ref 6–23)
CO2: 30 mEq/L (ref 19–32)
Calcium: 9.9 mg/dL (ref 8.4–10.5)
Chloride: 102 mEq/L (ref 96–112)
Creatinine, Ser: 0.74 mg/dL (ref 0.40–1.20)
GFR: 83.16 mL/min (ref >60.00)
Glucose, Bld: 151 mg/dL — ABNORMAL HIGH (ref 70–99)
Potassium: 4.6 mEq/L (ref 3.5–5.1)
Sodium: 138 mEq/L (ref 135–145)
Total Bilirubin: 0.4 mg/dL (ref 0.2–1.2)
Total Protein: 6.9 g/dL (ref 6.0–8.3)

## 2022-05-17 LAB — HEMOGLOBIN A1C: Hgb A1c MFr Bld: 7.1 % — ABNORMAL HIGH (ref 4.6–6.5)

## 2022-05-17 LAB — MICROALBUMIN / CREATININE URINE RATIO
Creatinine,U: 40.6 mg/dL
Microalb Creat Ratio: 1.7 mg/g (ref 0.0–30.0)
Microalb, Ur: <0.7 mg/dL (ref 0.0–1.9)

## 2022-05-17 LAB — LIPID PANEL
Cholesterol: 172 mg/dL (ref 0–200)
HDL: 49.9 mg/dL (ref >39.00)
LDL Cholesterol: 85 mg/dL (ref 0–99)
NonHDL: 122.3
Total CHOL/HDL Ratio: 3
Triglycerides: 189 mg/dL — ABNORMAL HIGH (ref 0.0–149.0)
VLDL: 37.8 mg/dL (ref 0.0–40.0)

## 2022-05-17 NOTE — Assessment & Plan Note (Signed)
Improving  Continue to monitor 

## 2022-05-17 NOTE — Assessment & Plan Note (Signed)
Improved on recheck. Continue to monitor.

## 2022-05-17 NOTE — Assessment & Plan Note (Signed)
Immunizations UTD. She will check on Prevnar.  Mammogram due in 2025 Bone density scan UTD. Colon cancer screening due in November 2024.   Discussed the importance of a healthy diet and regular exercise in order for weight loss, and to reduce the risk of further co-morbidity.  Exam stable. Labs pending.  Follow up in 1 year for repeat physical.

## 2022-05-17 NOTE — Assessment & Plan Note (Signed)
Controlled.  No concerns today. Continue to monitor.  

## 2022-05-17 NOTE — Assessment & Plan Note (Signed)
Repeat A1C pending.  Continue metformin 1000 mg BID. Urine microalbumin due and pending.   Follow up in 6 months.

## 2022-05-17 NOTE — Assessment & Plan Note (Signed)
Repeat lipid panel pending.  Discussed the importance of a healthy diet and regular exercise in order for weight loss, and to reduce the risk of further co-morbidity. Continue rosuvastatin 5 mg daily.

## 2022-05-17 NOTE — Progress Notes (Signed)
Subjective:    Patient ID: Catherine Orr, female    DOB: [birth year], 67 y.o.   MRN: [id number]  HPI  Catherine Orr is a very pleasant 67 y.o. female who presents today for complete physical and follow up of chronic conditions.  Immunizations: -Tetanus: Completed in 2011 -Shingles: Completed Shingrix series -Pneumonia: Completed Pneumovax 23 in 2021 and 2018. Unsure if she's had Prevnar.   Diet: Fair diet.  Exercise: No regular exercise.  Eye exam: Completes annually  Dental exam: Completes semi-annually    Mammogram: February 2023 Bone Density Scan: February 2023  Colonoscopy: Completed in 2011, completed Cologuard in November 2021. Declines colonoscopy. Due for Cologuard in November 2024.  BP Readings from Last 3 Encounters:  05/17/22 134/84  04/04/22 (!) 148/96  07/20/21 134/76   Wt Readings from Last 3 Encounters:  05/17/22 178 lb (80.7 kg)  04/04/22 180 lb (81.6 kg)  12/21/21 174 lb (78.9 kg)         Review of Systems  Constitutional:  Negative for unexpected weight change.  HENT:  Negative for rhinorrhea.   Respiratory:  Negative for cough and shortness of breath.   Cardiovascular:  Negative for chest pain.  Gastrointestinal:  Negative for constipation and diarrhea.  Genitourinary:  Negative for difficulty urinating.  Musculoskeletal:  Positive for arthralgias.  Skin:  Negative for rash.  Allergic/Immunologic: Negative for environmental allergies.  Neurological:  Negative for dizziness, numbness and headaches.  Psychiatric/Behavioral:  The patient is not nervous/anxious.          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)    Cellulitis and abscess of face    Hyperglycemia 11/08/2014   MR (mental retardation)    Other malaise and fatigue    Other screening breast examination    Routine general medical examination at a health care facility    Screening for lipoid disorders    Screening for malignant  neoplasm of the cervix    Special screening for malignant neoplasms, colon     Social History   Socioeconomic History   Marital status: Single    Spouse name: Not on file   Number of children: Not on file   Years of education: Not on file   Highest education level: Not on file  Occupational History   Not on file  Tobacco Use   Smoking status: Never   Smokeless tobacco: Never  Vaping Use   Vaping Use: Never used  Substance and Sexual Activity   Alcohol use: No   Drug use: No   Sexual activity: Not Currently  Other Topics Concern   Not on file  Social History Narrative   Lives in group home   Social Determinants of Health   Financial Resource Strain: Low Risk  (12/21/2021)   Overall Financial Resource Strain (CARDIA)    Difficulty of Paying Living Expenses: Not hard at all  Food Insecurity: No Food Insecurity (12/21/2021)   Hunger Vital Sign    Worried About Running Out of Food in the Last Year: Never true    Ran Out of Food in the Last Year: Never true  Transportation Needs: No Transportation Needs (12/21/2021)   PRAPARE - Hydrologist (Medical): No    Lack of Transportation (Non-Medical): No  Physical Activity: Sufficiently Active (12/21/2021)   Exercise Vital Sign    Days of Exercise per Week: 7 days    Minutes of Exercise per Session: 30 min  Stress:  No Stress Concern Present (12/21/2021)   [place]    Feeling of Stress : Not at all  Social Connections: Moderately Integrated (12/21/2021)   Social Connection and Isolation Panel [NHANES]    Frequency of Communication with Friends and Family: Once a week    Frequency of Social Gatherings with Friends and Family: More than three times a week    Attends Religious Services: More than 4 times per year    Active Member of [employer] or Organizations: Yes    Attends Archivist Meetings: More than 4 times per year    Marital  Status: Never married  Intimate Partner Violence: Not At Risk (12/21/2021)   Humiliation, Afraid, Rape, and Kick questionnaire    Fear of Current or Ex-Partner: No    Emotionally Abused: No    Physically Abused: No    Sexually Abused: No    History reviewed. No pertinent surgical history.  Family History  Problem Relation Age of Onset   Diabetes Mother    Breast cancer Sister 67   Breast cancer Maternal Aunt     No Known Allergies  Current Outpatient Medications on File Prior to Visit  Medication Sig Dispense Refill   metFORMIN (GLUCOPHAGE) 1000 MG tablet TAKE 1 TABLET BY MOUTH TWICE A DAY WITH MEAL FOR DIABETES 180 tablet 0   rosuvastatin (CRESTOR) 5 MG tablet TAKE ONE TABLET BY MOUTH EVERY EVENING FOR CHOLESTEROL 90 tablet 0   No current facility-administered medications on file prior to visit.    BP 134/84   Pulse 72   Temp (!) 97.2 F (36.2 C) (Temporal)   Ht 5\' 1"  (1.549 m)   Wt 178 lb (80.7 kg)   SpO2 97%   BMI 33.63 kg/m  Objective:   Physical Exam HENT:     Right Ear: Tympanic membrane and ear canal normal.     Left Ear: Tympanic membrane and ear canal normal.     Nose: Nose normal.  Eyes:     Conjunctiva/sclera: Conjunctivae normal.     Pupils: Pupils are equal, round, and reactive to light.  Neck:     Thyroid: No thyromegaly.  Cardiovascular:     Rate and Rhythm: Normal rate and regular rhythm.     Heart sounds: No murmur heard. Pulmonary:     Effort: Pulmonary effort is normal.     Breath sounds: Normal breath sounds. No rales.  Abdominal:     General: Bowel sounds are normal.     Palpations: Abdomen is soft.     Tenderness: There is no abdominal tenderness.  Musculoskeletal:        General: Normal range of motion.     Cervical back: Neck supple.  Lymphadenopathy:     Cervical: No cervical adenopathy.  Skin:    General: Skin is warm and dry.     Findings: No rash.  Neurological:     Mental Status: She is alert and oriented to person, place,  and time.     Cranial Nerves: No cranial nerve deficit.     Deep Tendon Reflexes: Reflexes are normal and symmetric.  Psychiatric:        Mood and Affect: Mood normal.           Assessment & Plan:  Preventative health care Assessment & Plan: Immunizations UTD. She will check on Prevnar.  Mammogram due in 2025 Bone density scan UTD. Colon cancer screening due in November 2024.   Discussed the  importance of a healthy diet and regular exercise in order for weight loss, and to reduce the risk of further co-morbidity.  Exam stable. Labs pending.  Follow up in 1 year for repeat physical.    Type 2 diabetes mellitus with hyperglycemia, without long-term current use of insulin ([place]) Assessment & Plan: Repeat A1C pending.  Continue metformin 1000 mg BID. Urine microalbumin due and pending.   Follow up in 6 months.  Orders: -     Microalbumin / creatinine urine ratio -     Hemoglobin A1c  Psoriasis Assessment & Plan: Controlled.  No concerns today. Continue to monitor.    Acute pain of left shoulder Assessment & Plan: Improving.   Continue to monitor.    Hyperlipidemia, unspecified hyperlipidemia type Assessment & Plan: Repeat lipid panel pending.  Discussed the importance of a healthy diet and regular exercise in order for weight loss, and to reduce the risk of further co-morbidity. Continue rosuvastatin 5 mg daily.   Orders: -     Lipid panel -     Comprehensive metabolic panel  Elevated blood pressure reading Assessment & Plan: Improved on recheck. Continue to monitor.          Pleas Koch, NP

## 2022-07-14 ENCOUNTER — Emergency Department
Admission: EM | Admit: 2022-07-14 | Discharge: 2022-07-14 | Disposition: A | Discharge status: Home / Self Care | Payer: Medicare HMO | Attending: Emergency Medicine | Admitting: Emergency Medicine

## 2022-07-14 ENCOUNTER — Other Ambulatory Visit: Payer: Self-pay

## 2022-07-14 DIAGNOSIS — W19XXXA Unspecified fall, initial encounter: Secondary | ICD-10-CM

## 2022-07-14 DIAGNOSIS — W010XXA Fall on same level from slipping, tripping and stumbling without subsequent striking against object, initial encounter: Secondary | ICD-10-CM | POA: Insufficient documentation

## 2022-07-14 DIAGNOSIS — S0121XA Laceration without foreign body of nose, initial encounter: Secondary | ICD-10-CM | POA: Insufficient documentation

## 2022-07-14 NOTE — ED Triage Notes (Addendum)
Pt presents to ED with c/o having a fall off the curb and pt has lac to bridge area of nose, bleeding controlled. Pt denies blood thinner use. NAD noted. Pt denies LOC.

## 2022-07-14 NOTE — ED Provider Notes (Signed)
[facility] Provider Note    Event Date/Time   First MD Initiated Contact with Patient 07/14/22 1257     (approximate)   History   Fall   HPI  Catherine Orr is a 67 y.o. female who presents after a mechanical fall.  Patient tripped and fell forward and injured the bridge of her nose.  She has a shallow laceration, she does not think that she broke her nose.  No other injuries reported.  No neck pain     Physical Exam   Triage Vital Signs: ED Triage Vitals  Enc Vitals Group     BP 07/14/22 1254 (!) 183/92     Pulse Rate 07/14/22 1254 (!) 108     Resp 07/14/22 1254 18     Temp 07/14/22 1254 98.4 F (36.9 C)     Temp Source 07/14/22 1254 Oral     SpO2 07/14/22 1254 95 %     Weight --      Height --      Head Circumference --      Peak Flow --      Pain Score 07/14/22 1255 5     Pain Loc --      Pain Edu? --      Excl. in GC? --     Most recent vital signs: Vitals:   07/14/22 1254  BP: (!) 183/92  Pulse: (!) 108  Resp: 18  Temp: 98.4 F (36.9 C)  SpO2: 95%     General: Awake, no distress.  CV:  Good peripheral perfusion.  Resp:  Normal effort.  Abd:  No distention.  Other:  Patient with 2 cm laceration vertically along the bridge of the nose, linear and well-approximated bleeding controlled   ED Results / Procedures / Treatments   Labs (all labs ordered are listed, but only abnormal results are displayed) Labs Reviewed - No data to display   EKG     RADIOLOGY     PROCEDURES:  Critical Care performed:   Marland KitchenMarland KitchenLaceration Repair  Date/Time: 07/14/2022 1:34 PM  Performed by: Jene Every, MD Authorized by: Jene Every, MD   Consent:    Consent obtained:  Verbal   Risks discussed:  Pain Laceration details:    Location:  Face   Face location:  Nose   Length (cm):  2 Treatment:    Area cleansed with:  Chlorhexidine   Amount of cleaning:  Standard Skin repair:    Repair method:  Tissue  adhesive Approximation:    Approximation:  Close Repair type:    Repair type:  Simple Post-procedure details:    Dressing:  Adhesive bandage   Procedure completion:  Tolerated well, no immediate complications    MEDICATIONS ORDERED IN ED: Medications - No data to display   IMPRESSION / MDM / ASSESSMENT AND PLAN / ED COURSE  I reviewed the triage vital signs and the nursing notes. Patient's presentation is most consistent with acute, uncomplicated illness.  Patient presents after a fall with primary complaint of laceration to the nose.  Overall nose is minimally swollen well-appearing, does not appear displaced.  Laceration repaired with skin adhesive without difficulty.        FINAL CLINICAL IMPRESSION(S) / ED DIAGNOSES   Final diagnoses:  Fall, initial encounter  Nasal laceration, initial encounter     Rx / DC Orders   ED Discharge Orders     None        Note:  This document was  prepared using Conservation officer, historic buildings and may include unintentional dictation errors.   Jene Every, MD 07/14/22 1335

## 2022-07-14 NOTE — ED Notes (Signed)
Band aide placed on bridge of nose.

## 2022-07-20 ENCOUNTER — Telehealth: Payer: Self-pay

## 2022-07-20 NOTE — Telephone Encounter (Signed)
Transition Care Management Follow-up Telephone Call Date of discharge and from where: 07/14/2022 [facility] How have you been since you were released from the hospital? Patient is feeling better. Spoke with patient's caregiver. Any questions or concerns? No  Items Reviewed: Did the pt receive and understand the discharge instructions provided? Yes  Medications obtained and verified? Yes  Other? No  Any new allergies since your discharge? No  Dietary orders reviewed? Yes Do you have support at home? Yes   Follow up appointments reviewed:  PCP Hospital f/u appt confirmed? No  Scheduled to see  on  @ . Specialist Hospital f/u appt confirmed? No  Scheduled to see  on  @ . Are transportation arrangements needed? No  If their condition worsens, is the pt aware to call PCP or go to the Emergency Dept.? Yes Was the patient provided with contact information for the PCP's office or ED? Yes Was to pt encouraged to call back with questions or concerns? Yes  Devinne Epstein [facility]  [facility] Population Health Community Resource Care Guide   [email or web address]  ?? [phone]   Website: [email or web address]  [email or web address]

## 2022-11-22 ENCOUNTER — Ambulatory Visit: Payer: Medicare HMO | Admitting: Primary Care

## 2022-11-22 ENCOUNTER — Encounter: Payer: Self-pay | Admitting: Primary Care

## 2022-11-22 VITALS — BP 132/82 | HR 100 | Temp 97.8°F | Ht 61.0 in | Wt 178.0 lb

## 2022-11-22 DIAGNOSIS — Z7984 Long term (current) use of oral hypoglycemic drugs: Secondary | ICD-10-CM | POA: Diagnosis not present

## 2022-11-22 DIAGNOSIS — E1165 Type 2 diabetes mellitus with hyperglycemia: Secondary | ICD-10-CM | POA: Diagnosis not present

## 2022-11-22 LAB — POCT GLYCOSYLATED HEMOGLOBIN (HGB A1C): Hemoglobin A1C: 6.7 % — AB (ref 4.0–5.6)

## 2022-11-22 NOTE — Progress Notes (Signed)
Subjective:    Patient ID: Catherine Orr, female    DOB: Dec 16, 1955, 67 y.o.   MRN: [id number]  HPI  Catherine Orr is a very pleasant 67 y.o. female with a history of type 2 diabetes, hyperlipidemia, psoriasis who presents today for follow up of diabetes.  Current medications include: Metformin 1000 mg twice daily  She is checking her blood glucose 0 times daily.  Last A1C: 7.1 in March 2024, 6.7 today Last Eye Exam: Up-to-date Last Foot Exam: Due Pneumonia Vaccination: Completed last in 2021 Urine Microalbumin: Up-to-date Statin: Rosuvastatin  Dietary changes since last visit: None   Exercise: Walking   BP Readings from Last 3 Encounters:  11/22/22 132/82  07/14/22 (!) 183/92  05/17/22 134/84       Review of Systems  Eyes:  Negative for visual disturbance.  Respiratory:  Negative for shortness of breath.   Cardiovascular:  Negative for chest pain.  Neurological:  Negative for numbness.         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)    Cellulitis and abscess of face    Hyperglycemia 11/08/2014   MR (mental retardation)    Other malaise and fatigue    Other screening breast examination    Routine general medical examination at a health care facility    Screening for lipoid disorders    Screening for malignant neoplasm of the cervix    Special screening for malignant neoplasms, colon     Social History   Socioeconomic History   Marital status: Single    Spouse name: Not on file   Number of children: Not on file   Years of education: Not on file   Highest education level: Not on file  Occupational History   Not on file  Tobacco Use   Smoking status: Never   Smokeless tobacco: Never  Vaping Use   Vaping status: Never Used  Substance and Sexual Activity   Alcohol use: No   Drug use: No   Sexual activity: Not Currently  Other Topics Concern   Not on file  Social History Narrative   Lives in group  home   Social Determinants of Health   Financial Resource Strain: Low Risk  (12/21/2021)   Overall Financial Resource Strain (CARDIA)    Difficulty of Paying Living Expenses: Not hard at all  Food Insecurity: No Food Insecurity (12/21/2021)   Hunger Vital Sign    Worried About Running Out of Food in the Last Year: Never true    Ran Out of Food in the Last Year: Never true  Transportation Needs: No Transportation Needs (12/21/2021)   PRAPARE - Administrator, Civil Service (Medical): No    Lack of Transportation (Non-Medical): No  Physical Activity: Sufficiently Active (12/21/2021)   Exercise Vital Sign    Days of Exercise per Week: 7 days    Minutes of Exercise per Session: 30 min  Stress: No Stress Concern Present (12/21/2021)   [employer] of Occupational Health - Occupational Stress Questionnaire    Feeling of Stress : Not at all  Social Connections: Moderately Integrated (12/21/2021)   Social Connection and Isolation Panel [NHANES]    Frequency of Communication with Friends and Family: Once a week    Frequency of Social Gatherings with Friends and Family: More than three times a week    Attends Religious Services: More than 4 times per year    Active Member of [employer] or Organizations:  Yes    Attends Club or Organization Meetings: More than 4 times per year    Marital Status: Never married  Intimate Partner Violence: Not At Risk (12/21/2021)   Humiliation, Afraid, Rape, and Kick questionnaire    Fear of Current or Ex-Partner: No    Emotionally Abused: No    Physically Abused: No    Sexually Abused: No    History reviewed. No pertinent surgical history.  Family History  Problem Relation Age of Onset   Diabetes Mother    Breast cancer Sister 58   Breast cancer Maternal Aunt     No Known Allergies  Current Outpatient Medications on File Prior to Visit  Medication Sig Dispense Refill   metFORMIN (GLUCOPHAGE) 1000 MG tablet TAKE ONE TABLET BY MOUTH TWICE A  DAY WITH MEAL FOR DIABETES 180 tablet 1   rosuvastatin (CRESTOR) 5 MG tablet TAKE ONE TABLET BY MOUTH EVERY EVENING FOR CHOLESTEROL 90 tablet 3   No current facility-administered medications on file prior to visit.    BP 132/82   Pulse 100   Temp 97.8 F (36.6 C) (Temporal)   Ht 5\' 1"  (1.549 m)   Wt 178 lb (80.7 kg)   SpO2 97%   BMI 33.63 kg/m  Objective:   Physical Exam Cardiovascular:     Rate and Rhythm: Normal rate and regular rhythm.  Pulmonary:     Effort: Pulmonary effort is normal.     Breath sounds: Normal breath sounds.  Musculoskeletal:     Cervical back: Neck supple.  Skin:    General: Skin is warm and dry.  Neurological:     Mental Status: She is alert and oriented to person, place, and time.  Psychiatric:        Mood and Affect: Mood normal.           Assessment & Plan:  Type 2 diabetes mellitus with hyperglycemia, without long-term current use of insulin ([facility]) Assessment & Plan: Controlled and improved with A1C of 6.7 today  Continue metformin 1000 mg BID.  Foot exam today.   Follow up in 6 months   Orders: -     POCT glycosylated hemoglobin (Hb A1C)        Doreene Nest, NP

## 2022-11-22 NOTE — Assessment & Plan Note (Addendum)
Controlled and improved with A1C of 6.7 today  Continue metformin 1000 mg BID.  Foot exam today.   Follow up in 6 months

## 2022-11-22 NOTE — Patient Instructions (Signed)
Continue metformin twice daily for diabetes.  Please schedule a physical to meet with me in 6 months.   It was a pleasure to see you today!

## 2023-01-07 ENCOUNTER — Ambulatory Visit (INDEPENDENT_AMBULATORY_CARE_PROVIDER_SITE_OTHER): Payer: Medicare HMO

## 2023-01-07 VITALS — Ht 61.0 in | Wt 178.0 lb

## 2023-01-07 DIAGNOSIS — Z0000 Encounter for general adult medical examination without abnormal findings: Secondary | ICD-10-CM | POA: Diagnosis not present

## 2023-01-07 DIAGNOSIS — Z1211 Encounter for screening for malignant neoplasm of colon: Secondary | ICD-10-CM

## 2023-01-07 NOTE — Patient Instructions (Addendum)
Ms. Tritsch , Thank you for taking time to come for your Medicare Wellness Visit. I appreciate your ongoing commitment to your health goals. Please review the following plan we discussed and let me know if I can assist you in the future.   Referrals/Orders/Follow-Ups/Clinician Recommendations: Yes; Order for Cologuard was placed 01/07/2023.  This is a list of the screening recommended for you and due dates:  Health Maintenance  Topic Date Due   Pneumonia Vaccine (2 of 2 - PCV) 12/14/2020   COVID-19 Vaccine (1 - 2023-24 season) Never done   Cologuard (Stool DNA test)  01/01/2023   Flu Shot  05/20/2023*   Mammogram  03/28/2023   Eye exam for diabetics  04/03/2023   Yearly kidney function blood test for diabetes  05/17/2023   Yearly kidney health urinalysis for diabetes  05/17/2023   Hemoglobin A1C  05/23/2023   Complete foot exam   11/22/2023   Medicare Annual Wellness Visit  01/07/2024   DEXA scan (bone density measurement)  Completed   Hepatitis C Screening  Completed   Zoster (Shingles) Vaccine  Completed   HPV Vaccine  Aged Out   DTaP/Tdap/Td vaccine  Discontinued   Colon Cancer Screening  Discontinued  *Topic was postponed. The date shown is not the original due date.    Advanced directives: (Declined) Advance directive discussed with you today. Even though you declined this today, please call our office should you change your mind, and we can give you the proper paperwork for you to fill out.  Next Medicare Annual Wellness Visit scheduled for next year: No

## 2023-01-07 NOTE — Progress Notes (Signed)
Subjective:   Catherine Orr is a 67 y.o. female who presents for Medicare Annual (Subsequent) preventive examination.  Visit Complete: Virtual I connected with  Gwenevere Ghazi on 01/07/23 by a audio enabled telemedicine application and verified that I am speaking with the correct person using two identifiers.  Patient Location: Home  Provider Location: Office/Clinic  I discussed the limitations of evaluation and management by telemedicine. The patient expressed understanding and agreed to proceed.  Vital Signs: Because this visit was a virtual/telehealth visit, some criteria may be missing or patient reported. Any vitals not documented were not able to be obtained and vitals that have been documented are patient reported.  Cardiac Risk Factors include: advanced age (>62men, >50 women);diabetes mellitus;dyslipidemia;obesity (BMI >30kg/m2)     Objective:    Today's Vitals   01/07/23 1003  Weight: 178 lb (80.7 kg)  Height: 5\' 1"  (1.549 m)  PainSc: 0-No pain   Body mass index is 33.63 kg/m.     01/07/2023   10:05 AM 12/21/2021    9:18 AM 12/20/2020    8:19 AM 12/16/2019    1:16 PM 08/13/2017    8:22 AM 11/07/2015    8:55 AM  Advanced Directives  Does Patient Have a Medical Advance Directive? No Yes Yes No Yes Yes  Type of Science writer of [place];Living will  Healthcare Power of [place];Living will Healthcare Power of Attorney  Does patient want to make changes to medical advance directive?   Yes (MAU/Ambulatory/Procedural Areas - Information given)   No - Patient declined  Copy of Healthcare Power of Attorney in Chart?  No - copy requested No - copy requested  No - copy requested No - copy requested  Would patient like information on creating a medical advance directive? No - Patient declined   No - Patient declined      Current Medications (verified) Outpatient Encounter Medications as of 01/07/2023  Medication Sig    metFORMIN (GLUCOPHAGE) 1000 MG tablet TAKE ONE TABLET BY MOUTH TWICE A DAY WITH MEAL FOR DIABETES   rosuvastatin (CRESTOR) 5 MG tablet TAKE ONE TABLET BY MOUTH EVERY EVENING FOR CHOLESTEROL   No facility-administered encounter medications on file as of 01/07/2023.    Allergies (verified) Patient has no known allergies.   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 No pertinent surgical history. Family History  Problem Relation Age of Onset   Diabetes Mother    Breast cancer Sister 65   Breast cancer Maternal Aunt    Social History   Socioeconomic History   Marital status: Single    Spouse name: Not on file   Number of children: Not on file   Years of education: Not on file   Highest education level: Not on file  Occupational History   Not on file  Tobacco Use   Smoking status: Never   Smokeless tobacco: Never  Vaping Use   Vaping status: Never Used  Substance and Sexual Activity   Alcohol use: No  Drug use: No   Sexual activity: Not Currently  Other Topics Concern   Not on file  Social History Narrative   Lives in group home   Social Determinants of Health   Financial Resource Strain: Low Risk  (01/07/2023)   Overall Financial Resource Strain (CARDIA)    Difficulty of Paying Living Expenses: Not hard at all  Food Insecurity: No Food Insecurity (01/07/2023)   Hunger Vital Sign    Worried About Running Out of Food in the Last Year: Never true    Ran Out of Food in the Last Year: Never true  Transportation Needs: No Transportation Needs  (01/07/2023)   PRAPARE - Administrator, Civil Service (Medical): No    Lack of Transportation (Non-Medical): No  Physical Activity: Sufficiently Active (01/07/2023)   Exercise Vital Sign    Days of Exercise per Week: 5 days    Minutes of Exercise per Session: 30 min  Stress: No Stress Concern Present (01/07/2023)   [employer] of Occupational Health - Occupational Stress Questionnaire    Feeling of Stress : Not at all  Social Connections: Moderately Integrated (01/07/2023)   Social Connection and Isolation Panel [NHANES]    Frequency of Communication with Friends and Family: Once a week    Frequency of Social Gatherings with Friends and Family: More than three times a week    Attends Religious Services: More than 4 times per year    Active Member of [employer] or Organizations: Yes    Attends Engineer, structural: More than 4 times per year    Marital Status: Never married    Tobacco Counseling Counseling given: Not Answered   Clinical Intake:     Pain Score: 0-No pain     BMI - recorded: 33.63 Nutritional Status: BMI > 30  Obese Nutritional Risks: None Diabetes: Yes CBG done?: No Did pt. bring in CBG monitor from home?: No  How often do you need to have someone help you when you read instructions, pamphlets, or other written materials from your doctor or pharmacy?: 1 - Never What is the last grade level you completed in school?: HSG  Interpreter Needed?: No  Information entered by :: Susie Cassette, LPN.   Activities of Daily Living    01/07/2023   10:08 AM  In your present state of health, do you have any difficulty performing the following activities:  Hearing? 0  Vision? 0  Difficulty concentrating or making decisions? 0  Walking or climbing stairs? 1  Dressing or bathing? 0  Doing errands, shopping? 0  Preparing Food and eating ? N  Using the Toilet? N  In the past six months, have you accidently leaked urine? N  Do you have  problems with loss of bowel control? N  Managing your Medications? N  Managing your Finances? N  Housekeeping or managing your Housekeeping? N    Patient Care Team: Doreene Nest, NP as PCP - General (Internal Medicine) Hart Carwin, MD (Inactive) (Gastroenterology)  Indicate any recent Medical Services you may have received from other than [facility] providers in the past year (date may be approximate).     Assessment:   This is a routine wellness examination for [facility].  Hearing/Vision screen Hearing Screening - Comments:: Patient denied any hearing difficulty.   No hearing aids.  Vision Screening - Comments:: Patient does wear corrective lenses/contacts/readers.  Annual eye exam done by: [facility]    Goals Addressed  This Visit's Progress    My healthcare goal for 2025 is to increase physical activity.        Depression Screen    01/07/2023   10:07 AM 05/17/2022   10:08 AM 04/04/2022    7:59 AM 12/21/2021    9:35 AM 12/20/2020    8:23 AM 12/16/2019    1:17 PM 12/15/2019    8:46 AM  PHQ 2/9 Scores  PHQ - 2 Score 0 0 1 0 0 0 0  PHQ- 9 Score 0     0 0    Fall Risk    01/07/2023   10:06 AM 11/22/2022    9:03 AM 05/17/2022   10:08 AM 04/04/2022    7:59 AM 12/21/2021    9:37 AM  Fall Risk   Falls in the past year? 1 1 0 0 0  Number falls in past yr: 0 0 0 0 0  Injury with Fall? 1 1 0 0 0  Risk for fall due to : History of fall(s);Impaired balance/gait History of fall(s) No Fall Risks No Fall Risks No Fall Risks  Follow up Education provided;Falls prevention discussed;Falls evaluation completed Falls evaluation completed Falls evaluation completed Falls evaluation completed Falls evaluation completed;Falls prevention discussed    MEDICARE RISK AT HOME: Medicare Risk at Home Any stairs in or around the home?: No If so, are there any without handrails?: No Home free of loose throw rugs in walkways, pet beds, electrical cords, etc?: Yes Adequate  lighting in your home to reduce risk of falls?: Yes Life alert?: No Use of a cane, walker or w/c?: Yes Grab bars in the bathroom?: Yes Shower chair or bench in shower?: Yes Elevated toilet seat or a handicapped toilet?: Yes  TIMED UP AND GO:  Was the test performed?  No    Cognitive Function:    01/07/2023   10:19 AM 12/16/2019    1:19 PM 08/13/2017    8:20 AM 11/07/2015    8:55 AM  MMSE - Mini Mental State Exam  Not completed: Unable to complete     Orientation to time  5 5 5   Orientation to Place  5 5 5   Registration  3 3 3   Attention/ Calculation  0 0 0  Recall  3 3 2   Recall-comments    pt was unable to recall 1 of 3 words  Language- name 2 objects   0 0  Language- repeat  1 1 1   Language- follow 3 step command   3 3  Language- read & follow direction   0 0  Write a sentence   0 0  Copy design   0 0  Total score   20 19        01/07/2023   10:19 AM 12/21/2021    9:43 AM  6CIT Screen  What Year? 0 points 0 points  What month? 0 points 0 points  What time? 0 points 0 points  Count back from 20 0 points   Months in reverse 0 points 0 points  Repeat phrase 0 points   Total Score 0 points     Immunizations Immunization History  Administered Date(s) Administered   Influenza,inj,Quad PF,6+ Mos 11/08/2014, 11/07/2015, 11/24/2016, 12/05/2017   Influenza-Unspecified 11/16/2013   Pneumococcal Polysaccharide-23 02/13/2017, 12/15/2019   Td 06/08/2009   Tdap 12/27/2020   Zoster Recombinant(Shingrix) 09/06/2018, 12/27/2018    TDAP status: Up to date  Flu Vaccine status: Declined, Education has been provided regarding the importance of this vaccine but  patient still declined. Advised may receive this vaccine at local pharmacy or Health Dept. Aware to provide a copy of the vaccination record if obtained from local pharmacy or Health Dept. Verbalized acceptance and understanding.  Pneumococcal vaccine status: Up to date  Covid-19 vaccine status: Declined, Education  has been provided regarding the importance of this vaccine but patient still declined. Advised may receive this vaccine at local pharmacy or Health Dept.or vaccine clinic. Aware to provide a copy of the vaccination record if obtained from local pharmacy or Health Dept. Verbalized acceptance and understanding.  Qualifies for Shingles Vaccine? Yes   Zostavax completed Yes   Shingrix Completed?: Yes  Screening Tests Health Maintenance  Topic Date Due   Pneumonia Vaccine 64+ Years old (2 of 2 - PCV) 12/14/2020   COVID-19 Vaccine (1 - 2023-24 season) Never done   Fecal DNA (Cologuard)  01/01/2023   INFLUENZA VACCINE  05/20/2023 (Originally 09/20/2022)   MAMMOGRAM  03/28/2023   OPHTHALMOLOGY EXAM  04/03/2023   Diabetic kidney evaluation - eGFR measurement  05/17/2023   Diabetic kidney evaluation - Urine ACR  05/17/2023   HEMOGLOBIN A1C  05/23/2023   FOOT EXAM  11/22/2023   Medicare Annual Wellness (AWV)  01/07/2024   DEXA SCAN  Completed   Hepatitis C Screening  Completed   Zoster Vaccines- Shingrix  Completed   HPV VACCINES  Aged Out   DTaP/Tdap/Td  Discontinued   Colonoscopy  Discontinued    Health Maintenance  Health Maintenance Due  Topic Date Due   Pneumonia Vaccine 14+ Years old (2 of 2 - PCV) 12/14/2020   COVID-19 Vaccine (1 - 2023-24 season) Never done   Fecal DNA (Cologuard)  01/01/2023    Colorectal cancer screening: Type of screening: Cologuard. Completed 01/12/2020. Repeat every 3 years-Order placed 01/07/2023  Mammogram status: Completed 03/27/2021. Repeat every year  Bone Density status: Completed 03/27/2021. Results reflect: Bone density results: NORMAL. Repeat every 5 years.  Lung Cancer Screening: (Low Dose CT Chest recommended if Age 93-80 years, 20 pack-year currently smoking OR have quit w/in 15years.) does not qualify.   Lung Cancer Screening Referral: no  Additional Screening:  Hepatitis C Screening: does qualify; Completed 11/01/2014  Vision Screening:  Recommended annual ophthalmology exams for early detection of glaucoma and other disorders of the eye. Is the patient up to date with their annual eye exam?  Yes  Who is the provider or what is the name of the office in which the patient attends annual eye exams? [facility] If pt is not established with a provider, would they like to be referred to a provider to establish care? No .   Dental Screening: Recommended annual dental exams for proper oral hygiene  Diabetic Foot Exam: Diabetic Foot Exam: Completed 11/22/2022  Community Resource Referral / Chronic Care Management: CRR required this visit?  No   CCM required this visit?  No     Plan:     I have personally reviewed and noted the following in the patient's chart:   Medical and social history Use of alcohol, tobacco or illicit drugs  Current medications and supplements including opioid prescriptions. Patient is not currently taking opioid prescriptions. Functional ability and status Nutritional status Physical activity Advanced directives List of other physicians Hospitalizations, surgeries, and ER visits in previous 12 months Vitals Screenings to include cognitive, depression, and falls Referrals and appointments  In addition, I have reviewed and discussed with patient certain preventive protocols, quality metrics, and best practice recommendations. A written  personalized care plan for preventive services as well as general preventive health recommendations were provided to patient.     Mickeal Needy, LPN   16/11/[id number]   After Visit Summary: (MyChart) Due to this being a telephonic visit, the after visit summary with patients personalized plan was offered to patient via MyChart   Nurse Notes: None

## 2023-01-23 DIAGNOSIS — Z1211 Encounter for screening for malignant neoplasm of colon: Secondary | ICD-10-CM | POA: Diagnosis not present

## 2023-01-30 LAB — COLOGUARD: COLOGUARD: NEGATIVE

## 2023-04-08 ENCOUNTER — Encounter: Payer: Self-pay | Admitting: Primary Care

## 2023-04-08 DIAGNOSIS — H2513 Age-related nuclear cataract, bilateral: Secondary | ICD-10-CM | POA: Diagnosis not present

## 2023-04-08 DIAGNOSIS — H11153 Pinguecula, bilateral: Secondary | ICD-10-CM | POA: Diagnosis not present

## 2023-04-08 DIAGNOSIS — E119 Type 2 diabetes mellitus without complications: Secondary | ICD-10-CM | POA: Diagnosis not present

## 2023-04-08 LAB — HM DIABETES EYE EXAM

## 2023-04-22 ENCOUNTER — Other Ambulatory Visit: Payer: Self-pay | Admitting: Primary Care

## 2023-04-22 DIAGNOSIS — E785 Hyperlipidemia, unspecified: Secondary | ICD-10-CM

## 2023-04-22 DIAGNOSIS — E1165 Type 2 diabetes mellitus with hyperglycemia: Secondary | ICD-10-CM

## 2023-05-28 ENCOUNTER — Encounter: Payer: Self-pay | Admitting: Primary Care

## 2023-05-28 ENCOUNTER — Ambulatory Visit (INDEPENDENT_AMBULATORY_CARE_PROVIDER_SITE_OTHER): Payer: Medicare HMO | Admitting: Primary Care

## 2023-05-28 VITALS — BP 110/74 | HR 93 | Temp 98.6°F | Ht 60.5 in | Wt 185.0 lb

## 2023-05-28 DIAGNOSIS — E785 Hyperlipidemia, unspecified: Secondary | ICD-10-CM | POA: Diagnosis not present

## 2023-05-28 DIAGNOSIS — Z7984 Long term (current) use of oral hypoglycemic drugs: Secondary | ICD-10-CM | POA: Diagnosis not present

## 2023-05-28 DIAGNOSIS — E1165 Type 2 diabetes mellitus with hyperglycemia: Secondary | ICD-10-CM | POA: Diagnosis not present

## 2023-05-28 DIAGNOSIS — Z1231 Encounter for screening mammogram for malignant neoplasm of breast: Secondary | ICD-10-CM

## 2023-05-28 DIAGNOSIS — E2839 Other primary ovarian failure: Secondary | ICD-10-CM | POA: Diagnosis not present

## 2023-05-28 DIAGNOSIS — Z0000 Encounter for general adult medical examination without abnormal findings: Secondary | ICD-10-CM

## 2023-05-28 LAB — HEMOGLOBIN A1C: Hgb A1c MFr Bld: 7.8 % — ABNORMAL HIGH (ref 4.6–6.5)

## 2023-05-28 LAB — LIPID PANEL
Cholesterol: 173 mg/dL (ref 0–200)
HDL: 43.7 mg/dL (ref >39.00)
LDL Cholesterol: 64 mg/dL (ref 0–99)
NonHDL: 129.65
Total CHOL/HDL Ratio: 4
Triglycerides: 330 mg/dL — ABNORMAL HIGH (ref 0.0–149.0)
VLDL: 66 mg/dL — ABNORMAL HIGH (ref 0.0–40.0)

## 2023-05-28 LAB — MICROALBUMIN / CREATININE URINE RATIO
Creatinine,U: 45.9 mg/dL
Microalb Creat Ratio: 24.1 mg/g (ref 0.0–30.0)
Microalb, Ur: 1.1 mg/dL (ref 0.0–1.9)

## 2023-05-28 LAB — COMPREHENSIVE METABOLIC PANEL WITH GFR
ALT: 14 U/L (ref 0–35)
AST: 10 U/L (ref 0–37)
Albumin: 4.7 g/dL (ref 3.5–5.2)
Alkaline Phosphatase: 70 U/L (ref 39–117)
BUN: 16 mg/dL (ref 6–23)
CO2: 29 meq/L (ref 19–32)
Calcium: 9.8 mg/dL (ref 8.4–10.5)
Chloride: 101 meq/L (ref 96–112)
Creatinine, Ser: 0.65 mg/dL (ref 0.40–1.20)
GFR: 89.84 mL/min (ref >60.00)
Glucose, Bld: 192 mg/dL — ABNORMAL HIGH (ref 70–99)
Potassium: 4.5 meq/L (ref 3.5–5.1)
Sodium: 139 meq/L (ref 135–145)
Total Bilirubin: 0.4 mg/dL (ref 0.2–1.2)
Total Protein: 6.8 g/dL (ref 6.0–8.3)

## 2023-05-28 NOTE — Patient Instructions (Signed)
 Stop by the lab prior to leaving today. I will notify you of your results once received.   Call the [facility] to schedule your bone density scan.   Complete your mammogram as scheduled.  Please schedule a follow up visit for 6 months for a diabetes check.  It was a pleasure to see you today!

## 2023-05-28 NOTE — Assessment & Plan Note (Signed)
 Repeat lipid panel pending. Continue rosuvastatin 5 mg daily.

## 2023-05-28 NOTE — Assessment & Plan Note (Signed)
 Declines Prevnar 20. Mammogram and bone density scan due, orders placed. Colon cancer screening UTD, due 2027  Discussed the importance of a healthy diet and regular exercise in order for weight loss, and to reduce the risk of further co-morbidity.  Exam stable. Labs pending.  Follow up in 1 year for repeat physical.

## 2023-05-28 NOTE — Progress Notes (Signed)
 Subjective:    Patient ID: Catherine Orr, female    DOB: 1955-04-12, 68 y.o.   MRN: [id number]  HPI  Catherine Orr is a very pleasant 68 y.o. female who presents today for complete physical and follow up of chronic conditions.  Immunizations: -Tetanus: Completed in 2022 -Shingles: Completed Shingrix series -Pneumonia: Completed Pneumovax in 2021  Diet: Fair diet.  Exercise: No regular exercise.  Eye exam: Completes annually  Dental exam: Completes >1 year   Mammogram: Completed in 2023 Bone Density Scan: Completed in 2023  Colonoscopy: Completed in 2024, negative   BP Readings from Last 3 Encounters:  05/28/23 110/74  11/22/22 132/82  07/14/22 (!) 183/92          Review of Systems  Constitutional:  Negative for unexpected weight change.  HENT:  Negative for rhinorrhea.   Respiratory:  Negative for cough and shortness of breath.   Cardiovascular:  Negative for chest pain.  Gastrointestinal:  Negative for constipation and diarrhea.  Genitourinary:  Negative for difficulty urinating.  Musculoskeletal:  Negative for arthralgias and myalgias.  Skin:  Negative for rash.  Allergic/Immunologic: Negative for environmental allergies.  Neurological:  Negative for dizziness, numbness and headaches.  Psychiatric/Behavioral:  The patient is not nervous/anxious.          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)    Cellulitis and abscess of face    Hyperglycemia 11/08/2014   MR (mental retardation)    Other malaise and fatigue    Other screening breast examination    Routine general medical examination at a health care facility    Screening for lipoid disorders    Screening for malignant neoplasm of the cervix    Special screening for malignant neoplasms, colon     Social History   Socioeconomic History   Marital status: Single    Spouse name: Not on file   Number of children: Not on file   Years of education: Not on  file   Highest education level: Not on file  Occupational History   Not on file  Tobacco Use   Smoking status: Never   Smokeless tobacco: Never  Vaping Use   Vaping status: Never Used  Substance and Sexual Activity   Alcohol use: No   Drug use: No   Sexual activity: Not Currently  Other Topics Concern   Not on file  Social History Narrative   Lives in group home   Social Drivers of Health   Financial Resource Strain: Low Risk  (01/07/2023)   Overall Financial Resource Strain (CARDIA)    Difficulty of Paying Living Expenses: Not hard at all  Food Insecurity: No Food Insecurity (01/07/2023)   Hunger Vital Sign    Worried About Running Out of Food in the Last Year: Never true    Ran Out of Food in the Last Year: Never true  Transportation Needs: No Transportation Needs (01/07/2023)   PRAPARE - Administrator, Civil Service (Medical): No    Lack of Transportation (Non-Medical): No  Physical Activity: Sufficiently Active (01/07/2023)   Exercise Vital Sign    Days of Exercise per Week: 5 days    Minutes of Exercise per Session: 30 min  Stress: No Stress Concern Present (01/07/2023)   [employer] of Occupational Health - Occupational Stress Questionnaire    Feeling of Stress : Not at all  Social Connections: Moderately Integrated (01/07/2023)   Social Connection and Isolation Panel [NHANES]  Frequency of Communication with Friends and Family: Once a week    Frequency of Social Gatherings with Friends and Family: More than three times a week    Attends Religious Services: More than 4 times per year    Active Member of [employer] or Organizations: Yes    Attends Banker Meetings: More than 4 times per year    Marital Status: Never married  Intimate Partner Violence: Not At Risk (01/07/2023)   Humiliation, Afraid, Rape, and Kick questionnaire    Fear of Current or Ex-Partner: No    Emotionally Abused: No    Physically Abused: No    Sexually Abused:  No    No past surgical history on file.  Family History  Problem Relation Age of Onset   Diabetes Mother    Breast cancer Sister 26   Breast cancer Maternal Aunt     No Known Allergies  Current Outpatient Medications on File Prior to Visit  Medication Sig Dispense Refill   metFORMIN (GLUCOPHAGE) 1000 MG tablet TAKE ONE TABLET BY MOUTH TWICE A DAY WITH MEAL FOR DIABETES 180 tablet 0   rosuvastatin (CRESTOR) 5 MG tablet TAKE ONE TABLET BY MOUTH EVERY EVENING FOR CHOLESTEROL 90 tablet 0   No current facility-administered medications on file prior to visit.    BP 110/74   Pulse 93   Temp 98.6 F (37 C) (Temporal)   Ht 5' 0.5" (1.537 m)   Wt 185 lb (83.9 kg)   SpO2 95%   BMI 35.54 kg/m  Objective:   Physical Exam HENT:     Right Ear: Tympanic membrane and ear canal normal.     Left Ear: Tympanic membrane and ear canal normal.  Eyes:     Pupils: Pupils are equal, round, and reactive to light.  Cardiovascular:     Rate and Rhythm: Normal rate and regular rhythm.  Pulmonary:     Effort: Pulmonary effort is normal.     Breath sounds: Normal breath sounds.  Abdominal:     General: Bowel sounds are normal.     Palpations: Abdomen is soft.     Tenderness: There is no abdominal tenderness.  Musculoskeletal:        General: Normal range of motion.     Cervical back: Neck supple.  Skin:    General: Skin is warm and dry.  Neurological:     Mental Status: She is alert and oriented to person, place, and time.     Cranial Nerves: No cranial nerve deficit.     Deep Tendon Reflexes:     Reflex Scores:      Patellar reflexes are 2+ on the right side and 2+ on the left side. Psychiatric:        Mood and Affect: Mood normal.           Assessment & Plan:  Preventative health care Assessment & Plan: Declines Prevnar 20. Mammogram and bone density scan due, orders placed. Colon cancer screening UTD, due 2027  Discussed the importance of a healthy diet and regular  exercise in order for weight loss, and to reduce the risk of further co-morbidity.  Exam stable. Labs pending.  Follow up in 1 year for repeat physical.    Type 2 diabetes mellitus with hyperglycemia, without long-term current use of insulin (HCC) Assessment & Plan: Repeat A1C pending.   Continue metformin 1000 mg BID.  Urine microalbumin pending.   Follow up in 6 months.   Orders: -  Microalbumin / creatinine urine ratio -     Hemoglobin A1c  Hyperlipidemia, unspecified hyperlipidemia type Assessment & Plan: Repeat lipid panel pending.  Continue rosuvastatin 5 mg daily.   Orders: -     Lipid panel -     Comprehensive metabolic panel with GFR  Screening mammogram for breast cancer -     3D Screening Mammogram, Left and Right; Future  Estrogen deficiency -     DG Bone Density; Future        Doreene Nest, NP

## 2023-05-28 NOTE — Assessment & Plan Note (Signed)
 Repeat A1C pending.   Continue metformin 1000 mg BID.  Urine microalbumin pending.   Follow up in 6 months.

## 2023-06-21 ENCOUNTER — Encounter

## 2023-07-22 ENCOUNTER — Other Ambulatory Visit: Payer: Self-pay | Admitting: Primary Care

## 2023-07-22 DIAGNOSIS — E785 Hyperlipidemia, unspecified: Secondary | ICD-10-CM

## 2023-07-22 DIAGNOSIS — E1165 Type 2 diabetes mellitus with hyperglycemia: Secondary | ICD-10-CM

## 2023-08-29 ENCOUNTER — Ambulatory Visit
Admission: RE | Admit: 2023-08-29 | Discharge: 2023-08-29 | Disposition: A | Discharge status: Home / Self Care | Source: Ambulatory Visit | Attending: Primary Care | Admitting: Primary Care

## 2023-08-29 DIAGNOSIS — Z1231 Encounter for screening mammogram for malignant neoplasm of breast: Secondary | ICD-10-CM | POA: Diagnosis not present

## 2023-09-03 ENCOUNTER — Ambulatory Visit: Payer: Self-pay | Admitting: Primary Care

## 2024-01-15 ENCOUNTER — Other Ambulatory Visit: Payer: Self-pay | Admitting: Primary Care

## 2024-01-15 DIAGNOSIS — E1165 Type 2 diabetes mellitus with hyperglycemia: Secondary | ICD-10-CM

## 2024-01-15 NOTE — Telephone Encounter (Signed)
Patient is due for diabetes follow up, this will be required prior to any further refills.  Please schedule, thank you!   

## 2024-03-18 ENCOUNTER — Other Ambulatory Visit

## 2024-06-25 ENCOUNTER — Ambulatory Visit

## 2024-06-25 ENCOUNTER — Other Ambulatory Visit

## 2024-07-02 ENCOUNTER — Encounter: Admitting: Primary Care
